# Patient Record
Sex: Female | Born: 1985 | Hispanic: Yes | Marital: Single | Smoking: Never smoker
Health system: Southern US, Community
[De-identification: ages and names within clinical notes are randomized; demographics above are authoritative.]

## PROBLEM LIST (undated history)

## (undated) DIAGNOSIS — N200 Calculus of kidney: Secondary | ICD-10-CM

## (undated) DIAGNOSIS — N83209 Unspecified ovarian cyst, unspecified side: Secondary | ICD-10-CM

## (undated) DIAGNOSIS — K219 Gastro-esophageal reflux disease without esophagitis: Secondary | ICD-10-CM

## (undated) HISTORY — DX: Calculus of kidney: N20.0

## (undated) HISTORY — DX: Unspecified ovarian cyst, unspecified side: N83.209

## (undated) HISTORY — DX: Gastro-esophageal reflux disease without esophagitis: K21.9

---

## 2009-10-09 HISTORY — PX: OOPHORECTOMY: SHX86

## 2010-10-09 HISTORY — PX: GALLBLADDER SURGERY: SHX652

## 2016-10-09 HISTORY — PX: KIDNEY STONE SURGERY: SHX686

## 2019-12-27 ENCOUNTER — Ambulatory Visit: Payer: Self-pay | Attending: Internal Medicine

## 2020-07-20 ENCOUNTER — Other Ambulatory Visit: Payer: Self-pay

## 2020-07-20 ENCOUNTER — Ambulatory Visit (INDEPENDENT_AMBULATORY_CARE_PROVIDER_SITE_OTHER): Payer: 59 | Admitting: Legal Medicine

## 2020-07-20 ENCOUNTER — Encounter: Payer: Self-pay | Admitting: Legal Medicine

## 2020-07-20 VITALS — BP 110/64 | HR 99 | Temp 97.7°F | Ht 65.75 in | Wt 223.4 lb

## 2020-07-20 DIAGNOSIS — J4520 Mild intermittent asthma, uncomplicated: Secondary | ICD-10-CM | POA: Diagnosis not present

## 2020-07-20 DIAGNOSIS — R5383 Other fatigue: Secondary | ICD-10-CM | POA: Diagnosis not present

## 2020-07-20 DIAGNOSIS — M542 Cervicalgia: Secondary | ICD-10-CM | POA: Diagnosis not present

## 2020-07-20 DIAGNOSIS — Z13220 Encounter for screening for lipoid disorders: Secondary | ICD-10-CM | POA: Diagnosis not present

## 2020-07-20 HISTORY — DX: Mild intermittent asthma, uncomplicated: J45.20

## 2020-07-20 LAB — PULMONARY FUNCTION TEST
FEV1/FVC: 71.7 %
FEV1: 2.42 L
FVC: 2.8 L

## 2020-07-20 MED ORDER — ALBUTEROL SULFATE HFA 108 (90 BASE) MCG/ACT IN AERS: 2.0000 | INHALATION_SPRAY | Freq: Four times a day (QID) | RESPIRATORY_TRACT | 0 refills | Status: DC | PRN

## 2020-07-20 NOTE — Patient Instructions (Signed)
  Ms. Amerman , Thank you for taking time to come for your Medicare Wellness Visit. I appreciate your ongoing commitment to your health goals. Please review the following plan we discussed and let me know if I can assist you in the future.   These are the goals we discussed: Goals   None     This is a list of the screening recommended for you and due dates:  Health Maintenance  Topic Date Due  .  Hepatitis C: One time screening is recommended by Center for Disease Control  (CDC) for  adults born from [birth year] through 1966.   Never done  . HIV Screening  Never done  . Tetanus Vaccine  Never done  . Pap Smear  Never done  . Flu Shot  Never done

## 2020-07-20 NOTE — Progress Notes (Signed)
New Patient Office Visit  Subjective:  Patient ID: Monica Randall, female    DOB: 1986/06/04  Age: 34 y.o. MRN: [id number]  CC:  Chief Complaint  Patient presents with  . Cough  . Back Pain    HPI Monica Randall presents for Asthma. Marla tranaslating since patient can only speak spanisn  Patient has mild intermittant asthma uncomplicated.  Asthma was diagnosed  child and has occasional daytime episodes and occasional night symptoms.  Patient is some symptoms and is using none.  Patient non smoker.  Cough  Neck pain at times, no trauma.  Past Medical History:  Diagnosis Date  . Kidney stones     Past Surgical History:  Procedure Laterality Date  . GALLBLADDER SURGERY  2012  . KIDNEY STONE SURGERY  2018   Laser surgery  . OOPHORECTOMY Right 2011    Family History  Problem Relation Age of Onset  . Thyroid disease Maternal Grandfather   . Thyroid disease Other     Social History   Socioeconomic History  . Marital status: Married    Spouse name: Not on file  . Number of children: Not on file  . Years of education: Not on file  . Highest education level: Not on file  Occupational History  . Occupation: Diplomatic Services operational officer  Tobacco Use  . Smoking status: Never Smoker  . Smokeless tobacco: Never Used  Substance and Sexual Activity  . Alcohol use: Not Currently  . Drug use: Never  . Sexual activity: Yes  Other Topics Concern  . Not on file  Social History Narrative  . Not on file   Social Determinants of Health   Financial Resource Strain:   . Difficulty of Paying Living Expenses: Not on file  Food Insecurity:   . Worried About Charity fundraiser in the Last Year: Not on file  . Ran Out of Food in the Last Year: Not on file  Transportation Needs:   . Lack of Transportation (Medical): Not on file  . Lack of Transportation (Non-Medical): Not on file  Physical Activity:   . Days of Exercise per Week: Not on file  . Minutes of Exercise per Session: Not on file    Stress:   . Feeling of Stress : Not on file  Social Connections:   . Frequency of Communication with Friends and Family: Not on file  . Frequency of Social Gatherings with Friends and Family: Not on file  . Attends Religious Services: Not on file  . Active Member of Clubs or Organizations: Not on file  . Attends Archivist Meetings: Not on file  . Marital Status: Not on file  Intimate Partner Violence:   . Fear of Current or Ex-Partner: Not on file  . Emotionally Abused: Not on file  . Physically Abused: Not on file  . Sexually Abused: Not on file    ROS Review of Systems  Constitutional: Positive for activity change. Negative for appetite change.  HENT: Negative.   Eyes: Negative.   Respiratory: Positive for cough and wheezing.   Cardiovascular: Negative for chest pain, palpitations and leg swelling.  Endocrine: Negative.   Genitourinary: Negative.   Neurological: Negative.   Psychiatric/Behavioral: Negative.     Objective:   Today's Vitals: BP 110/64   Pulse 99   Temp 97.7 F (36.5 C)   Ht 5' 5.75" (1.67 m)   Wt 223 lb 6.4 oz (101.3 kg)   SpO2 97%   BMI 36.33 kg/m   Physical Exam  Vitals reviewed.  Constitutional:      Appearance: Normal appearance.  HENT:     Right Ear: Tympanic membrane normal.     Left Ear: Tympanic membrane normal.     Nose: Nose normal.     Mouth/Throat:     Mouth: Mucous membranes are moist.  Eyes:     Extraocular Movements: Extraocular movements intact.     Conjunctiva/sclera: Conjunctivae normal.     Pupils: Pupils are equal, round, and reactive to light.  Cardiovascular:     Rate and Rhythm: Normal rate and regular rhythm.     Pulses: Normal pulses.     Heart sounds: Normal heart sounds.  Pulmonary:     Effort: Pulmonary effort is normal.     Breath sounds: Wheezing present.  Abdominal:     General: Abdomen is flat. Bowel sounds are normal.     Palpations: Abdomen is soft.  Musculoskeletal:        General: Normal  range of motion.     Cervical back: Normal range of motion and neck supple.  Skin:    General: Skin is warm.     Capillary Refill: Capillary refill takes less than 2 seconds.  Neurological:     General: No focal deficit present.     Mental Status: She is alert and oriented to person, place, and time. Mental status is at baseline.   PFT: pre- FVC 100%, FEV1 93%, FEV1.FVC 98, PEF 109, post bronchodilator is the same.  Assessment & Plan:   Problem List Items Addressed This Visit      Respiratory   Mild intermittent asthma   Relevant Medications   albuterol (VENTOLIN HFA) 108 (90 Base) MCG/ACT inhaler   Other Relevant Orders   CBC with Differential/Platelet   Pulmonary Function Test This patient has asthma mild and is on albuterol HFA.  Patient is having a flair.  Chronic medicines include albuterol HFA. Addition new medicines no.  Asthma action plan is in place.     Other Visit Diagnoses    Screening for hyperlipidemia    -  Primary   Relevant Orders   Lipid panel Patient needs to be screened for hyperlipidemia   Fatigue, unspecified type       Relevant Orders   Comprehensive metabolic panel   Hemoglobin A1c   TSH Patient remains fatigued aand needs further workup.   Cervical pain       Relevant Orders   DG Cervical Spine Complete Mild cervical pain, we discussed ROM exercises      Outpatient Encounter Medications as of 07/20/2020  Medication Sig  . Multiple Vitamins-Minerals (MULTIVITAMIN WITH MINERALS) tablet Take 1 tablet by mouth daily.  Marland Kitchen albuterol (VENTOLIN HFA) 108 (90 Base) MCG/ACT inhaler Inhale 2 puffs into the lungs every 6 (six) hours as needed for wheezing or shortness of breath.   No facility-administered encounter medications on file as of 07/20/2020.    Follow-up: Return in about 6 months (around 01/18/2021).   Reinaldo Meeker, MD

## 2020-07-21 ENCOUNTER — Encounter: Payer: Self-pay | Admitting: Legal Medicine

## 2020-07-21 ENCOUNTER — Other Ambulatory Visit: Payer: Self-pay

## 2020-07-21 DIAGNOSIS — E782 Mixed hyperlipidemia: Secondary | ICD-10-CM

## 2020-07-21 DIAGNOSIS — R7303 Prediabetes: Secondary | ICD-10-CM | POA: Insufficient documentation

## 2020-07-21 DIAGNOSIS — M542 Cervicalgia: Secondary | ICD-10-CM

## 2020-07-21 HISTORY — DX: Mixed hyperlipidemia: E78.2

## 2020-07-21 HISTORY — DX: Prediabetes: R73.03

## 2020-07-21 LAB — COMPREHENSIVE METABOLIC PANEL
ALT: 12 IU/L (ref 0–32)
AST: 17 IU/L (ref 0–40)
Albumin/Globulin Ratio: 1.4 (ref 1.2–2.2)
Albumin: 4.3 g/dL (ref 3.8–4.8)
Alkaline Phosphatase: 64 IU/L (ref 44–121)
BUN/Creatinine Ratio: 13 (ref 9–23)
BUN: 9 mg/dL (ref 6–20)
Bilirubin Total: <0.2 mg/dL (ref 0.0–1.2)
CO2: 22 mmol/L (ref 20–29)
Calcium: 9.5 mg/dL (ref 8.7–10.2)
Chloride: 102 mmol/L (ref 96–106)
Creatinine, Ser: 0.69 mg/dL (ref 0.57–1.00)
GFR calc Af Amer: 130 mL/min/{1.73_m2} (ref >59)
GFR calc non Af Amer: 113 mL/min/{1.73_m2} (ref >59)
Globulin, Total: 3.1 g/dL (ref 1.5–4.5)
Glucose: 92 mg/dL (ref 65–99)
Potassium: 4.4 mmol/L (ref 3.5–5.2)
Sodium: 140 mmol/L (ref 134–144)
Total Protein: 7.4 g/dL (ref 6.0–8.5)

## 2020-07-21 LAB — CBC WITH DIFFERENTIAL/PLATELET
Basophils Absolute: 0.1 10*3/uL (ref 0.0–0.2)
Basos: 1 %
EOS (ABSOLUTE): 0.1 10*3/uL (ref 0.0–0.4)
Eos: 1 %
Hematocrit: 45.5 % (ref 34.0–46.6)
Hemoglobin: 14.2 g/dL (ref 11.1–15.9)
Immature Grans (Abs): 0.1 10*3/uL (ref 0.0–0.1)
Immature Granulocytes: 1 %
Lymphocytes Absolute: 3.8 10*3/uL — ABNORMAL HIGH (ref 0.7–3.1)
Lymphs: 37 %
MCH: 26.9 pg (ref 26.6–33.0)
MCHC: 31.2 g/dL — ABNORMAL LOW (ref 31.5–35.7)
MCV: 86 fL (ref 79–97)
Monocytes Absolute: 0.8 10*3/uL (ref 0.1–0.9)
Monocytes: 7 %
Neutrophils Absolute: 5.5 10*3/uL (ref 1.4–7.0)
Neutrophils: 53 %
Platelets: 352 10*3/uL (ref 150–450)
RBC: 5.28 x10E6/uL (ref 3.77–5.28)
RDW: 12.9 % (ref 11.7–15.4)
WBC: 10.3 10*3/uL (ref 3.4–10.8)

## 2020-07-21 LAB — HEMOGLOBIN A1C
Est. average glucose Bld gHb Est-mCnc: 117 mg/dL
Hgb A1c MFr Bld: 5.7 % — ABNORMAL HIGH (ref 4.8–5.6)

## 2020-07-21 LAB — LIPID PANEL
Chol/HDL Ratio: 3 ratio (ref 0.0–4.4)
Cholesterol, Total: 192 mg/dL (ref 100–199)
HDL: 65 mg/dL (ref >39)
LDL Chol Calc (NIH): 95 mg/dL (ref 0–99)
Triglycerides: 188 mg/dL — ABNORMAL HIGH (ref 0–149)
VLDL Cholesterol Cal: 32 mg/dL (ref 5–40)

## 2020-07-21 LAB — TSH: TSH: 2.11 u[IU]/mL (ref 0.450–4.500)

## 2020-07-21 LAB — CARDIOVASCULAR RISK ASSESSMENT

## 2020-07-21 NOTE — Progress Notes (Signed)
CBC normal, kidney and liver tests normal, triglycerides high 188 watch diet, A1c 5.7 , This in in prediabetes range, should be on a diabetic diet, TSH 2.11 normal range

## 2020-07-27 ENCOUNTER — Other Ambulatory Visit: Payer: Self-pay | Admitting: Legal Medicine

## 2020-07-27 ENCOUNTER — Telehealth: Payer: Self-pay

## 2020-07-27 ENCOUNTER — Other Ambulatory Visit: Payer: Self-pay

## 2020-07-27 DIAGNOSIS — M542 Cervicalgia: Secondary | ICD-10-CM

## 2020-07-27 MED ORDER — CYCLOBENZAPRINE HCL 10 MG PO TABS: 10.0000 mg | ORAL_TABLET | Freq: Three times a day (TID) | ORAL | 0 refills | Status: DC | PRN

## 2020-07-27 NOTE — Progress Notes (Signed)
cy

## 2020-07-27 NOTE — Telephone Encounter (Signed)
Patient was informed that Dr Henrene Pastor sent flexeril to the pharmacy. Recommeded to call us back if symptopms persistent.

## 2020-09-13 ENCOUNTER — Encounter: Payer: Self-pay | Admitting: Legal Medicine

## 2020-09-13 ENCOUNTER — Other Ambulatory Visit: Payer: Self-pay

## 2020-09-13 ENCOUNTER — Ambulatory Visit (INDEPENDENT_AMBULATORY_CARE_PROVIDER_SITE_OTHER): Payer: 59 | Admitting: Legal Medicine

## 2020-09-13 DIAGNOSIS — N63 Unspecified lump in unspecified breast: Secondary | ICD-10-CM

## 2020-09-13 DIAGNOSIS — E282 Polycystic ovarian syndrome: Secondary | ICD-10-CM

## 2020-09-13 NOTE — Progress Notes (Signed)
Subjective:  Patient ID: Monica Randall, female    DOB: 05-19-1986  Age: 34 y.o. MRN: [id number]  Chief Complaint  Patient presents with  . Breast Pain    pt requesting referral to GYN, flu shot   translator used from [facility] HPI: patient having breast both breast L.R. no discharge or trauma.  ONe year ago.  Periods regular , pelvic pain. For long time has poly cystic ovaries.  Patient just wants referral to GYN.   Current Outpatient Medications on File Prior to Visit  Medication Sig Dispense Refill  . albuterol (VENTOLIN HFA) 108 (90 Base) MCG/ACT inhaler Inhale 2 puffs into the lungs every 6 (six) hours as needed for wheezing or shortness of breath. 8 g 0  . cyclobenzaprine (FLEXERIL) 10 MG tablet Take 1 tablet (10 mg total) by mouth 3 (three) times daily as needed for muscle spasms. 30 tablet 0  . Multiple Vitamins-Minerals (MULTIVITAMIN WITH MINERALS) tablet Take 1 tablet by mouth daily.     No current facility-administered medications on file prior to visit.   Past Medical History:  Diagnosis Date  . Kidney stones   . Mild intermittent asthma 07/20/2020  . Mixed hyperlipidemia 07/21/2020  . Prediabetes 07/21/2020   Past Surgical History:  Procedure Laterality Date  . GALLBLADDER SURGERY  2012  . KIDNEY STONE SURGERY  2018   Laser surgery  . OOPHORECTOMY Right 2011    Family History  Problem Relation Age of Onset  . Thyroid disease Maternal Grandfather   . Thyroid disease Other    Social History   Socioeconomic History  . Marital status: Married    Spouse name: Not on file  . Number of children: Not on file  . Years of education: Not on file  . Highest education level: Not on file  Occupational History  . Occupation: Diplomatic Services operational officer  Tobacco Use  . Smoking status: Never Smoker  . Smokeless tobacco: Never Used  Substance and Sexual Activity  . Alcohol use: Not Currently  . Drug use: Never  . Sexual activity: Yes  Other Topics Concern  . Not on file  Social History  Narrative  . Not on file   Social Determinants of Health   Financial Resource Strain:   . Difficulty of Paying Living Expenses: Not on file  Food Insecurity:   . Worried About Charity fundraiser in the Last Year: Not on file  . Ran Out of Food in the Last Year: Not on file  Transportation Needs:   . Lack of Transportation (Medical): Not on file  . Lack of Transportation (Non-Medical): Not on file  Physical Activity:   . Days of Exercise per Week: Not on file  . Minutes of Exercise per Session: Not on file  Stress:   . Feeling of Stress : Not on file  Social Connections:   . Frequency of Communication with Friends and Family: Not on file  . Frequency of Social Gatherings with Friends and Family: Not on file  . Attends Religious Services: Not on file  . Active Member of Clubs or Organizations: Not on file  . Attends Archivist Meetings: Not on file  . Marital Status: Not on file    Review of Systems  Constitutional: Negative for activity change, chills and fever.  HENT: Negative for congestion, mouth sores and nosebleeds.   Respiratory: Negative for apnea, choking and wheezing.   Cardiovascular: Positive for chest pain (breasts). Negative for palpitations and leg swelling.  Gastrointestinal: Positive for abdominal  pain (pelvic).  Genitourinary: Positive for flank pain. Negative for difficulty urinating, dyspareunia and dysuria.  Musculoskeletal: Positive for arthralgias. Negative for back pain and gait problem.     Objective:  BP 110/80   Pulse (!) 102   Temp (!) 97.4 F (36.3 C)   Ht 5\' 7"  (1.702 m)   Wt 222 lb (100.7 kg)   SpO2 98%   BMI 34.77 kg/m   BP/Weight 09/13/2020 96/01/5408  Systolic BP 811 914  Diastolic BP 80 64  Wt. (Lbs) 222 223.4  BMI 34.77 36.33  sandra [id number]  Physical Exam Vitals reviewed.  Constitutional:      Appearance: Normal appearance. She is obese.  Eyes:     Extraocular Movements: Extraocular movements intact.     Pupils:  Pupils are equal, round, and reactive to light.  Cardiovascular:     Rate and Rhythm: Normal rate and regular rhythm.     Pulses: Normal pulses.     Heart sounds: Normal heart sounds.  Pulmonary:     Effort: Pulmonary effort is normal.     Breath sounds: Normal breath sounds.  Abdominal:     Tenderness: There is abdominal tenderness.  Musculoskeletal:        General: Normal range of motion.  Neurological:     Mental Status: She is alert and oriented to person, place, and time.       Lab Results  Component Value Date   WBC 10.3 07/20/2020   HGB 14.2 07/20/2020   HCT 45.5 07/20/2020   PLT 352 07/20/2020   GLUCOSE 92 07/20/2020   CHOL 192 07/20/2020   TRIG 188 (H) 07/20/2020   HDL 65 07/20/2020   LDLCALC 95 07/20/2020   ALT 12 07/20/2020   AST 17 07/20/2020   NA 140 07/20/2020   K 4.4 07/20/2020   CL 102 07/20/2020   CREATININE 0.69 07/20/2020   BUN 9 07/20/2020   CO2 22 07/20/2020   TSH 2.110 07/20/2020   HGBA1C 5.7 (H) 07/20/2020      Assessment & Plan:  Diagnoses and all orders for this visit: Breast mass -     Ambulatory referral to Gynecology Patient having bilateral breast pain for one years left greager than right  PCOS (polycystic ovarian syndrome) -     Ambulatory referral to Gynecology Chronic lower pelvic pain chronic         Follow-up: Return if symptoms worsen or fail to improve.  An After Visit Summary was printed and given to the patient.  Reinaldo Meeker, MD [facility] [phone]

## 2020-09-27 ENCOUNTER — Ambulatory Visit: Payer: 59 | Admitting: Obstetrics & Gynecology

## 2020-10-12 ENCOUNTER — Other Ambulatory Visit: Payer: Self-pay

## 2020-10-12 ENCOUNTER — Ambulatory Visit: Payer: Self-pay | Admitting: Obstetrics & Gynecology

## 2020-10-20 ENCOUNTER — Other Ambulatory Visit: Payer: Self-pay

## 2020-10-20 ENCOUNTER — Encounter: Payer: Self-pay | Admitting: Obstetrics & Gynecology

## 2020-11-30 ENCOUNTER — Other Ambulatory Visit: Payer: Self-pay

## 2020-11-30 ENCOUNTER — Ambulatory Visit (INDEPENDENT_AMBULATORY_CARE_PROVIDER_SITE_OTHER): Payer: 59 | Admitting: Obstetrics & Gynecology

## 2020-11-30 ENCOUNTER — Encounter: Payer: Self-pay | Admitting: Obstetrics & Gynecology

## 2020-11-30 VITALS — BP 128/84 | Ht 64.5 in | Wt 223.0 lb

## 2020-11-30 DIAGNOSIS — Z30011 Encounter for initial prescription of contraceptive pills: Secondary | ICD-10-CM

## 2020-11-30 DIAGNOSIS — Z1151 Encounter for screening for human papillomavirus (HPV): Secondary | ICD-10-CM

## 2020-11-30 DIAGNOSIS — Z01419 Encounter for gynecological examination (general) (routine) without abnormal findings: Secondary | ICD-10-CM

## 2020-11-30 DIAGNOSIS — Z113 Encounter for screening for infections with a predominantly sexual mode of transmission: Secondary | ICD-10-CM

## 2020-11-30 DIAGNOSIS — Z23 Encounter for immunization: Secondary | ICD-10-CM

## 2020-11-30 MED ORDER — NORETHIN ACE-ETH ESTRAD-FE 1-20 MG-MCG(24) PO TABS: 1.0000 | ORAL_TABLET | Freq: Every day | ORAL | 4 refills | Status: DC

## 2020-11-30 NOTE — Progress Notes (Signed)
Monica Randall Dec 30, 1985 [id number]   History:    35 y.o. G19P0A2 Married  RP:  New patient presenting for annual gyn exam   HPI: Well on BCPs, stopped this month because ran out of pills.  Used condoms.  Would like to restart on BCPs, doesn't remember the name of her BCP.  No BTB.  No pelvic pain.  Urine/BMs normal.  Breasts normal.  BMI 37.69.  Needs to increase physical activities.  Past medical history,surgical history, family history and social history were all reviewed and documented in the EPIC chart.  Gynecologic History Patient's last menstrual period was 11/10/2020.  Obstetric History OB History  Gravida Para Term Preterm AB Living  2 0     2    SAB IAB Ectopic Multiple Live Births  2            # Outcome Date GA Lbr Len/2nd Weight Sex Delivery Anes PTL Lv  2 SAB           1 SAB              ROS: A ROS was performed and pertinent positives and negatives are included in the history.  GENERAL: No fevers or chills. HEENT: No change in vision, no earache, sore throat or sinus congestion. NECK: No pain or stiffness. CARDIOVASCULAR: No chest pain or pressure. No palpitations. PULMONARY: No shortness of breath, cough or wheeze. GASTROINTESTINAL: No abdominal pain, nausea, vomiting or diarrhea, melena or bright red blood per rectum. GENITOURINARY: No urinary frequency, urgency, hesitancy or dysuria. MUSCULOSKELETAL: No joint or muscle pain, no back pain, no recent trauma. DERMATOLOGIC: No rash, no itching, no lesions. ENDOCRINE: No polyuria, polydipsia, no heat or cold intolerance. No recent change in weight. HEMATOLOGICAL: No anemia or easy bruising or bleeding. NEUROLOGIC: No headache, seizures, numbness, tingling or weakness. PSYCHIATRIC: No depression, no loss of interest in normal activity or change in sleep pattern.     Exam:   BP 128/84   Ht 5' 4.5" (1.638 m)   Wt 223 lb (101.2 kg)   LMP 11/10/2020 Comment: PILL  BMI 37.69 kg/m   Body mass index is 37.69  kg/m.  General appearance : Well developed well nourished female. No acute distress HEENT: Eyes: no retinal hemorrhage or exudates,  Neck supple, trachea midline, no carotid bruits, no thyroidmegaly Lungs: Clear to auscultation, no rhonchi or wheezes, or rib retractions  Heart: Regular rate and rhythm, no murmurs or gallops Breast:Examined in sitting and supine position were symmetrical in appearance, no palpable masses or tenderness,  no skin retraction, no nipple inversion, no nipple discharge, no skin discoloration, no axillary or supraclavicular lymphadenopathy Abdomen: no palpable masses or tenderness, no rebound or guarding Extremities: no edema or skin discoloration or tenderness  Pelvic: Vulva: Normal             Vagina: No gross lesions or discharge  Cervix: No gross lesions or discharge.  Pap/HPV HR, Gono-Chlam done.  Uterus  AV, normal size, shape and consistency, non-tender and mobile  Adnexa  Without masses or tenderness  Anus: Normal   Assessment/Plan:  35 y.o. female for annual exam   1. Encounter for routine gynecological examination with Papanicolaou smear of cervix Normal gynecologic exam.  Pap test with high-risk HPV done today.  Breast exam normal. Body mass index 37.69.  Recommend a lower calorie/carb diet with intermittent fasting.  Aerobic activities 5 times a week and light weightlifting every 2 days.  2. Encounter for initial prescription of  contraceptive pills Desires contraception.  Decision to start on birth control pills.  No contraindication.  Birth control pill usage reviewed.  The generic of Loestrin 24 FE 1/20 prescribed.  3. Screen for STD (sexually transmitted disease) - Gono-Chlam done on Pap - HIV antibody (with reflex) - RPR - Hepatitis B Surface AntiGEN - Hepatitis C Antibody  4. Need for HPV vaccination First injection given today.  Other orders - PRESCRIPTION MEDICATION; Birth control - name?? - Norethindrone Acetate-Ethinyl Estrad-FE  (LOESTRIN 24 FE) 1-20 MG-MCG(24) tablet; Take 1 tablet by mouth daily.  Princess Bruins MD, 2:54 PM 11/30/2020

## 2020-12-01 ENCOUNTER — Encounter: Payer: Self-pay | Admitting: Obstetrics & Gynecology

## 2020-12-01 LAB — RPR: RPR Ser Ql: NONREACTIVE

## 2020-12-01 LAB — HEPATITIS C ANTIBODY
Hepatitis C Ab: NONREACTIVE
SIGNAL TO CUT-OFF: 0.01 (ref <1.00)

## 2020-12-01 LAB — HEPATITIS B SURFACE ANTIGEN: Hepatitis B Surface Ag: NONREACTIVE

## 2020-12-01 LAB — HIV ANTIBODY (ROUTINE TESTING W REFLEX): HIV 1&2 Ab, 4th Generation: NONREACTIVE

## 2020-12-02 LAB — PAP IG, CT-NG NAA, HPV HIGH-RISK
C. trachomatis RNA, TMA: NOT DETECTED
HPV DNA High Risk: NOT DETECTED
N. gonorrhoeae RNA, TMA: NOT DETECTED

## 2020-12-09 ENCOUNTER — Other Ambulatory Visit: Payer: Self-pay

## 2020-12-09 MED ORDER — CYCLOBENZAPRINE HCL 10 MG PO TABS: 10.0000 mg | ORAL_TABLET | Freq: Two times a day (BID) | ORAL | 1 refills | Status: DC

## 2020-12-09 MED ORDER — ALBUTEROL SULFATE HFA 108 (90 BASE) MCG/ACT IN AERS: 2.0000 | INHALATION_SPRAY | Freq: Two times a day (BID) | RESPIRATORY_TRACT | 1 refills | Status: DC

## 2020-12-15 ENCOUNTER — Other Ambulatory Visit: Payer: Self-pay | Admitting: Legal Medicine

## 2020-12-15 DIAGNOSIS — Z1231 Encounter for screening mammogram for malignant neoplasm of breast: Secondary | ICD-10-CM

## 2021-01-03 ENCOUNTER — Ambulatory Visit
Admission: RE | Admit: 2021-01-03 | Discharge: 2021-01-03 | Disposition: A | Discharge status: Home / Self Care | Payer: 59 | Source: Ambulatory Visit | Attending: Legal Medicine | Admitting: Legal Medicine

## 2021-01-03 DIAGNOSIS — Z1231 Encounter for screening mammogram for malignant neoplasm of breast: Secondary | ICD-10-CM

## 2021-01-04 NOTE — Progress Notes (Signed)
Breast normal, no malignancy lp

## 2021-01-18 ENCOUNTER — Other Ambulatory Visit: Payer: Self-pay

## 2021-01-18 ENCOUNTER — Ambulatory Visit: Payer: 59 | Admitting: Legal Medicine

## 2021-01-18 ENCOUNTER — Encounter: Payer: Self-pay | Admitting: Legal Medicine

## 2021-01-18 VITALS — BP 102/80 | HR 87 | Temp 97.4°F | Resp 16 | Ht 64.5 in | Wt 222.0 lb

## 2021-01-18 DIAGNOSIS — J4520 Mild intermittent asthma, uncomplicated: Secondary | ICD-10-CM

## 2021-01-18 DIAGNOSIS — R922 Inconclusive mammogram: Secondary | ICD-10-CM

## 2021-01-18 DIAGNOSIS — R7303 Prediabetes: Secondary | ICD-10-CM

## 2021-01-18 DIAGNOSIS — E282 Polycystic ovarian syndrome: Secondary | ICD-10-CM

## 2021-01-18 DIAGNOSIS — G5603 Carpal tunnel syndrome, bilateral upper limbs: Secondary | ICD-10-CM | POA: Diagnosis not present

## 2021-01-18 DIAGNOSIS — E782 Mixed hyperlipidemia: Secondary | ICD-10-CM | POA: Diagnosis not present

## 2021-01-18 NOTE — Progress Notes (Addendum)
Subjective:  Patient ID: Monica Randall, female    DOB: 1986-06-14  Age: 35 y.o. MRN: [id number]  Chief Complaint  Patient presents with  . Numbness    HPI: chronic visit Numbness of hands, worse in morning.  There is no numbness now.  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  Mammogram was normal but dense tissue, read as normal.   Patient has prediabetes and PCOS.   Current Outpatient Medications on File Prior to Visit  Medication Sig Dispense Refill  . albuterol (VENTOLIN HFA) 108 (90 Base) MCG/ACT inhaler Inhale 2 puffs into the lungs in the morning and at bedtime. 8 g 1  . cyclobenzaprine (FLEXERIL) 10 MG tablet Take 1 tablet (10 mg total) by mouth in the morning and at bedtime. 30 tablet 1  . Multiple Vitamins-Minerals (MULTIVITAMIN WITH MINERALS) tablet Take 1 tablet by mouth daily.    . Norethindrone Acetate-Ethinyl Estrad-FE (LOESTRIN 24 FE) 1-20 MG-MCG(24) tablet Take 1 tablet by mouth daily. 84 tablet 4  . PRESCRIPTION MEDICATION Birth control - name??     No current facility-administered medications on file prior to visit.   Past Medical History:  Diagnosis Date  . Kidney stones   . Mild intermittent asthma 07/20/2020  . Mixed hyperlipidemia 07/21/2020  . Prediabetes 07/21/2020   Past Surgical History:  Procedure Laterality Date  . GALLBLADDER SURGERY  2012  . KIDNEY STONE SURGERY  2018   Laser surgery  . OOPHORECTOMY Right 2011    Family History  Problem Relation Age of Onset  . Thyroid disease Maternal Grandfather   . Thyroid disease Other   . Cancer Maternal Aunt        leukemia   Social History   Socioeconomic History  . Marital status: Married    Spouse name: Not on file  . Number of children: Not on file  . Years of education: Not on file  . Highest education level: Not on file  Occupational History  . Occupation: Diplomatic Services operational officer  Tobacco Use  . Smoking status: Never Smoker  . Smokeless tobacco: Never Used  Vaping Use  . Vaping Use: Never  used  Substance and Sexual Activity  . Alcohol use: Not Currently  . Drug use: Never  . Sexual activity: Yes    Partners: Male    Comment: 1st intercourse- 12, partners- 42, current partner- 3 yrs   Other Topics Concern  . Not on file  Social History Narrative  . Not on file   Social Determinants of Health   Financial Resource Strain: Not on file  Food Insecurity: Not on file  Transportation Needs: Not on file  Physical Activity: Not on file  Stress: Not on file  Social Connections: Not on file    Review of Systems  Constitutional: Negative for activity change and appetite change.  HENT: Negative.   Eyes: Negative for visual disturbance.  Respiratory: Negative for chest tightness and shortness of breath.   Cardiovascular: Negative for chest pain, palpitations and leg swelling.  Gastrointestinal: Negative for abdominal distention and abdominal pain.  Endocrine: Negative for polyuria.  Genitourinary: Negative for difficulty urinating and dyspareunia.  Musculoskeletal: Negative for arthralgias and back pain.  Neurological: Positive for numbness (both hands).  Hematological: Negative.   Psychiatric/Behavioral: Negative.      Objective:  BP 102/80   Pulse 87   Temp (!) 97.4 F (36.3 C)   Resp 16   Ht 5' 4.5" (1.638 m)   Wt 222 lb (100.7 kg)  LMP 12/31/2020   SpO2 97%   BMI 37.52 kg/m   BP/Weight 01/18/2021 11/30/2020 97/12/5327  Systolic BP 924 268 341  Diastolic BP 80 84 80  Wt. (Lbs) 222 223 222  BMI 37.52 37.69 34.77    Physical Exam Vitals reviewed.  Constitutional:      Appearance: Normal appearance.  HENT:     Right Ear: Tympanic membrane normal.     Left Ear: Tympanic membrane normal.  Eyes:     Extraocular Movements: Extraocular movements intact.     Pupils: Pupils are equal, round, and reactive to light.  Cardiovascular:     Rate and Rhythm: Normal rate and regular rhythm.     Pulses: Normal pulses.     Heart sounds: Normal heart sounds.   Pulmonary:     Effort: Pulmonary effort is normal.     Breath sounds: Normal breath sounds.  Abdominal:     General: Abdomen is flat. Bowel sounds are normal.     Palpations: Abdomen is soft.  Musculoskeletal:        General: Normal range of motion.     Cervical back: Neck supple.     Comments: Negative tinel sign, bilat, normal sensation  Skin:    General: Skin is warm and dry.  Neurological:     General: No focal deficit present.     Mental Status: She is alert and oriented to person, place, and time.    Diabetic Foot Exam - Simple   Simple Foot Form Diabetic Foot exam was performed with the following findings: Yes 01/19/2021  7:36 AM  Visual Inspection No deformities, no ulcerations, no other skin breakdown bilaterally: Yes Sensation Testing Intact to touch and monofilament testing bilaterally: Yes Pulse Check Posterior Tibialis and Dorsalis pulse intact bilaterally: Yes Comments      Lab Results  Component Value Date   WBC 7.4 01/18/2021   HGB 13.9 01/18/2021   HCT 42.0 01/18/2021   PLT 378 01/18/2021   GLUCOSE 87 01/18/2021   CHOL 182 01/18/2021   TRIG 99 01/18/2021   HDL 64 01/18/2021   LDLCALC 100 (H) 01/18/2021   ALT 15 01/18/2021   AST 24 01/18/2021   NA 141 01/18/2021   K 4.8 01/18/2021   CL 102 01/18/2021   CREATININE 0.71 01/18/2021   BUN 9 01/18/2021   CO2 20 01/18/2021   TSH 2.110 07/20/2020   HGBA1C 5.6 01/18/2021      Assessment & Plan:   Diagnoses and all orders for this visit: Bilateral carpal tunnel syndrome -     Nerve conduction test We will get NCS and continue to brace wrists  Dense breast tissue on mammogram Dense breast tissue but no evidence for cancer, routine follow up  Mild intermittent asthma without complication This patient has asthma mild and is on albuterol.  Patient is not having a flair.  Chronic medicines include albuterol. Addition new medicines none.  Asthma action plan is in place.   Mixed hyperlipidemia -      Lipid panel AN INDIVIDUAL CARE PLAN for hyperlipidemia/ cholesterol was established and reinforced today.  The patient's status was assessed using clinical findings on exam, lab and other diagnostic tests. The patient's disease status was assessed based on evidence-based guidelines and found to be fair controlled. MEDICATIONS were reviewed. SELF MANAGEMENT GOALS have been discussed and patient's success at attaining the goal of low cholesterol was assessed. RECOMMENDATION given include regular exercise 3 days a week and low cholesterol/low fat diet. CLINICAL SUMMARY including  written plan to identify barriers unique to the patient due to social or economic  reasons was discussed.  Prediabetes -     Hemoglobin A1c -     CBC with Differential/Platelet -     Comprehensive metabolic panel Patient has prediabetes and is on diet  PCOS (polycystic ovarian syndrome) -     CBC with Differential/Platelet -     Comprehensive metabolic panel Not  Under treatment at present. Other orders -     Cardiovascular Risk Assessment      Follow-up: Return in about 1 month (around 02/17/2021) for carpal tunnel.  An After Visit Summary was printed and given to the patient.  Reinaldo Meeker, MD [facility] [phone]

## 2021-01-19 LAB — CBC WITH DIFFERENTIAL/PLATELET
Basophils Absolute: 0.1 10*3/uL (ref 0.0–0.2)
Basos: 1 %
EOS (ABSOLUTE): 0.1 10*3/uL (ref 0.0–0.4)
Eos: 1 %
Hematocrit: 42 % (ref 34.0–46.6)
Hemoglobin: 13.9 g/dL (ref 11.1–15.9)
Immature Grans (Abs): 0 10*3/uL (ref 0.0–0.1)
Immature Granulocytes: 0 %
Lymphocytes Absolute: 2.6 10*3/uL (ref 0.7–3.1)
Lymphs: 35 %
MCH: 28.2 pg (ref 26.6–33.0)
MCHC: 33.1 g/dL (ref 31.5–35.7)
MCV: 85 fL (ref 79–97)
Monocytes Absolute: 0.4 10*3/uL (ref 0.1–0.9)
Monocytes: 6 %
Neutrophils Absolute: 4.3 10*3/uL (ref 1.4–7.0)
Neutrophils: 57 %
Platelets: 378 10*3/uL (ref 150–450)
RBC: 4.93 x10E6/uL (ref 3.77–5.28)
RDW: 13.1 % (ref 11.7–15.4)
WBC: 7.4 10*3/uL (ref 3.4–10.8)

## 2021-01-19 LAB — LIPID PANEL
Chol/HDL Ratio: 2.8 ratio (ref 0.0–4.4)
Cholesterol, Total: 182 mg/dL (ref 100–199)
HDL: 64 mg/dL (ref >39)
LDL Chol Calc (NIH): 100 mg/dL — ABNORMAL HIGH (ref 0–99)
Triglycerides: 99 mg/dL (ref 0–149)
VLDL Cholesterol Cal: 18 mg/dL (ref 5–40)

## 2021-01-19 LAB — COMPREHENSIVE METABOLIC PANEL
ALT: 15 IU/L (ref 0–32)
AST: 24 IU/L (ref 0–40)
Albumin/Globulin Ratio: 1.7 (ref 1.2–2.2)
Albumin: 4.6 g/dL (ref 3.8–4.8)
Alkaline Phosphatase: 68 IU/L (ref 44–121)
BUN/Creatinine Ratio: 13 (ref 9–23)
BUN: 9 mg/dL (ref 6–20)
Bilirubin Total: 0.2 mg/dL (ref 0.0–1.2)
CO2: 20 mmol/L (ref 20–29)
Calcium: 9.4 mg/dL (ref 8.7–10.2)
Chloride: 102 mmol/L (ref 96–106)
Creatinine, Ser: 0.71 mg/dL (ref 0.57–1.00)
Globulin, Total: 2.7 g/dL (ref 1.5–4.5)
Glucose: 87 mg/dL (ref 65–99)
Potassium: 4.8 mmol/L (ref 3.5–5.2)
Sodium: 141 mmol/L (ref 134–144)
Total Protein: 7.3 g/dL (ref 6.0–8.5)
eGFR: 114 mL/min/{1.73_m2} (ref >59)

## 2021-01-19 LAB — CARDIOVASCULAR RISK ASSESSMENT

## 2021-01-19 LAB — HEMOGLOBIN A1C
Est. average glucose Bld gHb Est-mCnc: 114 mg/dL
Hgb A1c MFr Bld: 5.6 % (ref 4.8–5.6)

## 2021-01-19 NOTE — Progress Notes (Signed)
A1c 5.6, good, LDL cholesterol 100, CBC normal, kidney and liver tests normal lp

## 2021-01-31 ENCOUNTER — Ambulatory Visit (INDEPENDENT_AMBULATORY_CARE_PROVIDER_SITE_OTHER): Payer: 59 | Admitting: *Deleted

## 2021-01-31 ENCOUNTER — Other Ambulatory Visit: Payer: Self-pay

## 2021-01-31 VITALS — BP 110/70 | HR 74 | Resp 16 | Wt 222.0 lb

## 2021-01-31 DIAGNOSIS — Z23 Encounter for immunization: Secondary | ICD-10-CM | POA: Diagnosis not present

## 2021-01-31 NOTE — Progress Notes (Signed)
Ina Homes is the interpretor for patient. Patient is aware to return in 61mths for her last gardasil injection

## 2021-02-21 ENCOUNTER — Ambulatory Visit: Payer: 59 | Admitting: Legal Medicine

## 2021-02-22 ENCOUNTER — Encounter: Payer: Self-pay | Admitting: Legal Medicine

## 2021-02-22 ENCOUNTER — Ambulatory Visit: Payer: 59 | Admitting: Legal Medicine

## 2021-02-22 ENCOUNTER — Other Ambulatory Visit: Payer: Self-pay

## 2021-02-22 VITALS — BP 102/80 | HR 78 | Temp 97.4°F | Resp 16 | Ht 64.5 in | Wt 224.0 lb

## 2021-02-22 DIAGNOSIS — G5603 Carpal tunnel syndrome, bilateral upper limbs: Secondary | ICD-10-CM

## 2021-02-22 NOTE — Progress Notes (Signed)
Subjective:  Patient ID: Monica Randall, female    DOB: 12/01/1985  Age: 35 y.o. MRN: [id number]  Chief Complaint  Patient presents with  . Carpal Tunnel    HPI: patient has bilateral CTS.she is raising her hands at night and she is having lest CTS.  She still needs NCS.  marla translated Current Outpatient Medications on File Prior to Visit  Medication Sig Dispense Refill  . albuterol (VENTOLIN HFA) 108 (90 Base) MCG/ACT inhaler Inhale 2 puffs into the lungs in the morning and at bedtime. 8 g 1  . Multiple Vitamins-Minerals (MULTIVITAMIN WITH MINERALS) tablet Take 1 tablet by mouth daily.    . Norethindrone Acetate-Ethinyl Estrad-FE (LOESTRIN 24 FE) 1-20 MG-MCG(24) tablet Take 1 tablet by mouth daily. 84 tablet 4   No current facility-administered medications on file prior to visit.   Past Medical History:  Diagnosis Date  . Kidney stones   . Mild intermittent asthma 07/20/2020  . Mixed hyperlipidemia 07/21/2020  . Prediabetes 07/21/2020   Past Surgical History:  Procedure Laterality Date  . GALLBLADDER SURGERY  2012  . KIDNEY STONE SURGERY  2018   Laser surgery  . OOPHORECTOMY Right 2011    Family History  Problem Relation Age of Onset  . Thyroid disease Maternal Grandfather   . Thyroid disease Other   . Cancer Maternal Aunt        leukemia   Social History   Socioeconomic History  . Marital status: Married    Spouse name: Not on file  . Number of children: Not on file  . Years of education: Not on file  . Highest education level: Not on file  Occupational History  . Occupation: Dipl[employer] Services operational officer  Tobacco Use  . Smoking status: Never Smoker  . Smokeless tobacco: Never Used  Vaping Use  . Vaping Use: Never used  Substance and Sexual Activity  . Alcohol use: Not Currently  . Drug use: Never  . Sexual activity: Yes    Partners: Male    Comment: 1st intercourse- 12, partners- 36, current partner- 3 yrs   Other Topics Concern  . Not on file  Social History Narrative   . Not on file   Social Determinants of Health   Financial Resource Strain: Not on file  Food Insecurity: Not on file  Transportation Needs: Not on file  Physical Activity: Not on file  Stress: Not on file  Social Connections: Not on file    Review of Systems  Constitutional: Negative for activity change and appetite change.  HENT: Negative for rhinorrhea.   Eyes: Negative for visual disturbance.  Respiratory: Negative for chest tightness and shortness of breath.   Cardiovascular: Negative for palpitations and leg swelling.  Gastrointestinal: Negative for abdominal distention and abdominal pain.  Endocrine: Positive for polyuria.  Genitourinary: Negative for difficulty urinating.  Musculoskeletal: Negative for arthralgias and back pain.  Skin: Negative.   Neurological: Positive for numbness.  Psychiatric/Behavioral: Negative.      Objective:  BP 102/80   Pulse 78   Temp (!) 97.4 F (36.3 C)   Resp 16   Ht 5' 4.5" (1.638 m)   Wt 224 lb (101.6 kg)   LMP 01/26/2021 (Exact Date)   SpO2 97%   BMI 37.86 kg/m   BP/Weight 02/22/2021 01/31/2021 6/44/0347  Systolic BP 425 956 387  Diastolic BP 80 70 80  Wt. (Lbs) 224 222 222  BMI 37.86 37.52 37.52    Physical Exam Vitals reviewed.  Constitutional:  Appearance: Normal appearance.  HENT:     Right Ear: Tympanic membrane, ear canal and external ear normal.     Left Ear: Tympanic membrane, ear canal and external ear normal.  Cardiovascular:     Rate and Rhythm: Normal rate and regular rhythm.     Pulses: Normal pulses.     Heart sounds: Normal heart sounds. No murmur heard. No gallop.   Pulmonary:     Effort: No respiratory distress.     Breath sounds: Normal breath sounds. No rales.  Abdominal:     General: Abdomen is flat. Bowel sounds are normal. There is no distension.     Palpations: Abdomen is soft.     Tenderness: There is no abdominal tenderness.  Musculoskeletal:        General: Normal range of  motion.  Skin:    General: Skin is warm.     Capillary Refill: Capillary refill takes less than 2 seconds.  Neurological:     Mental Status: She is alert and oriented to person, place, and time. Mental status is at baseline.     Comments: Negative tinel bilaterally, no sensory or motor loss in hands.       Lab Results  Component Value Date   WBC 7.4 01/18/2021   HGB 13.9 01/18/2021   HCT 42.0 01/18/2021   PLT 378 01/18/2021   GLUCOSE 87 01/18/2021   CHOL 182 01/18/2021   TRIG 99 01/18/2021   HDL 64 01/18/2021   LDLCALC 100 (H) 01/18/2021   ALT 15 01/18/2021   AST 24 01/18/2021   NA 141 01/18/2021   K 4.8 01/18/2021   CL 102 01/18/2021   CREATININE 0.71 01/18/2021   BUN 9 01/18/2021   CO2 20 01/18/2021   TSH 2.110 07/20/2020   HGBA1C 5.6 01/18/2021      Assessment & Plan:   1. Bilateral carpal tunnel syndrome Bilateral CTS  Improving and need NCS       Follow-up: Return in about 1 month (around 03/25/2021) for for CTS.  An After Visit Summary was printed and given to the patient.  Reinaldo Meeker, MD [facility] [phone]

## 2021-02-24 ENCOUNTER — Telehealth: Payer: Self-pay | Admitting: *Deleted

## 2021-02-24 NOTE — Telephone Encounter (Signed)
Patient called stating she did have any refills on Loestrin 24 FE tablet. I see Dr.Lavoie sent Rx on 11/30/20 #84 with 4 refill. I called and tried to speak with the pharmacist however the phone cut off x 2. I called and left a detailed message on pharmacy doctor line and Rx information asking them to fill.

## 2021-03-01 ENCOUNTER — Other Ambulatory Visit: Payer: Self-pay

## 2021-03-01 ENCOUNTER — Encounter: Payer: Self-pay | Admitting: Neurology

## 2021-03-01 DIAGNOSIS — R202 Paresthesia of skin: Secondary | ICD-10-CM

## 2021-03-09 ENCOUNTER — Other Ambulatory Visit: Payer: Self-pay

## 2021-03-09 ENCOUNTER — Ambulatory Visit (INDEPENDENT_AMBULATORY_CARE_PROVIDER_SITE_OTHER): Payer: 59 | Admitting: Neurology

## 2021-03-09 DIAGNOSIS — G5603 Carpal tunnel syndrome, bilateral upper limbs: Secondary | ICD-10-CM

## 2021-03-09 DIAGNOSIS — R202 Paresthesia of skin: Secondary | ICD-10-CM | POA: Diagnosis not present

## 2021-03-09 NOTE — Progress Notes (Signed)
Positive nCS for CTS refer to ortho lp

## 2021-03-09 NOTE — Procedures (Signed)
[facility]  [address], [place]  [place], [place] [address] Tel: [phone] Fax:  [phone] Test Date:  03/09/2021  Patient: Monica Randall DOB: [birth year] Physician: Narda Amber, DO  Sex: Female Height: 5\' 4"  Ref Phys: Ann Held, MD  ID#: [id number]   Technician:    Patient Complaints: This is a 35 year old female referred for evaluation of bilateral hand numbness and tingling.  NCV & EMG Findings: Extensive electrodiagnostic testing of the right upper extremity and additional studies of the left shows:  1. Bilateral median sensory responses show prolonged latency (R3.9, L3.9 ms).  Bilateral ulnar sensory responses are within normal limits. 2. Bilateral median motor responses show prolonged latency (R4.3, L4.1 ms).  Bilateral ulnar motor responses are within normal limits.   3. There is no evidence of active or chronic motor axonal loss changes affecting any of the tested muscles.  Motor unit configuration and recruitment pattern is within normal limits.    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     ___________________________ Narda Amber, DO    Nerve Conduction Studies Anti Sensory Summary Table   Stim Site NR Peak (ms) Norm Peak (ms) P-T Amp (V) Norm P-T Amp  Left Median Anti Sensory (2nd Digit)  32C  Wrist    3.9 <3.4 23.7 >20  Right Median Anti Sensory (2nd Digit)  32C  Wrist    3.9 <3.4 24.6 >20  Left Ulnar Anti Sensory (5th Digit)  32C  Wrist    2.4 <3.1 25.9 >12  Right Ulnar Anti Sensory (5th Digit)  32C  Wrist    2.2 <3.1 27.8 >12   Motor Summary Table   Stim Site NR Onset (ms) Norm Onset (ms) O-P Amp (mV) Norm O-P Amp Site1 Site2 Delta-0 (ms) Dist (cm) Vel (m/s) Norm Vel (m/s)  Left Median Motor (Abd Poll Brev)  32C  Wrist    4.1 <3.9 8.4 >6 Elbow Wrist 4.9 27.0 55 >50  Elbow    9.0  8.1         Right Median Motor (Abd Poll  Brev)  32C  Wrist    4.3 <3.9 8.8 >6 Elbow Wrist 4.5 27.0 60 >50  Elbow    8.8  8.6         Left Ulnar Motor (Abd Dig Minimi)  32C  Wrist    2.0 <3.1 11.0 >7 B Elbow Wrist 3.3 21.0 64 >50  B Elbow    5.3  10.7  A Elbow B Elbow 1.8 10.0 56 >50  A Elbow    7.1  10.7         Right Ulnar Motor (Abd Dig Minimi)  32C  Wrist    1.9 <3.1 11.0 >7 B Elbow Wrist 3.3 21.0 64 >50  B Elbow    5.2  10.3  A Elbow B Elbow 1.8 10.0 56 >50  A Elbow    7.0  10.3          EMG   Side Muscle Ins Act Fibs Psw Fasc Number Recrt Dur Dur. Amp Amp. Poly Poly. Comment  Right 1stDorInt Nml Nml Nml Nml Nml Nml Nml Nml Nml Nml Nml Nml N/A  Right Abd Poll Brev Nml Nml Nml Nml Nml Nml Nml Nml Nml Nml Nml Nml N/A  Right PronatorTeres Nml Nml Nml Nml Nml Nml Nml Nml Nml Nml Nml Nml N/A  Right Biceps Nml Nml Nml Nml Nml  Nml Nml Nml Nml Nml Nml Nml N/A  Right Triceps Nml Nml Nml Nml Nml Nml Nml Nml Nml Nml Nml Nml N/A  Right Deltoid Nml Nml Nml Nml Nml Nml Nml Nml Nml Nml Nml Nml N/A  Left 1stDorInt Nml Nml Nml Nml Nml Nml Nml Nml Nml Nml Nml Nml N/A  Left Abd Poll Brev Nml Nml Nml Nml Nml Nml Nml Nml Nml Nml Nml Nml N/A  Left PronatorTeres Nml Nml Nml Nml Nml Nml Nml Nml Nml Nml Nml Nml N/A  Left Biceps Nml Nml Nml Nml Nml Nml Nml Nml Nml Nml Nml Nml N/A  Left Triceps Nml Nml Nml Nml Nml Nml Nml Nml Nml Nml Nml Nml N/A  Left Deltoid Nml Nml Nml Nml Nml Nml Nml Nml Nml Nml Nml Nml N/A      Waveforms:

## 2021-04-18 ENCOUNTER — Encounter: Payer: Self-pay | Admitting: Legal Medicine

## 2021-04-18 ENCOUNTER — Ambulatory Visit: Payer: 59 | Admitting: Legal Medicine

## 2021-04-18 ENCOUNTER — Other Ambulatory Visit: Payer: Self-pay

## 2021-04-18 DIAGNOSIS — G5600 Carpal tunnel syndrome, unspecified upper limb: Secondary | ICD-10-CM | POA: Insufficient documentation

## 2021-04-18 DIAGNOSIS — J9811 Atelectasis: Secondary | ICD-10-CM | POA: Diagnosis not present

## 2021-04-18 DIAGNOSIS — G5603 Carpal tunnel syndrome, bilateral upper limbs: Secondary | ICD-10-CM | POA: Diagnosis not present

## 2021-04-18 NOTE — Progress Notes (Signed)
Established Patient Office Visit  Subjective:  Patient ID: Monica Randall, female    DOB: April 01, 1986  Age: 35 y.o. MRN: [id number]  CC:  Chief Complaint  Patient presents with   Carpal Tunnel   Pneumonia   Follow-up    HPI Monica Randall presents for follow up on hospital visit on 03/18/2021.  She had strep throat and some atelectasis on CXR, no cough, fever or chills,  She has not seen neuro for NCS.  She gets numbness both hands bilaterally.  Radiated to elbow.she is not using wrist braces.    Past Medical History:  Diagnosis Date   Kidney stones    Mild intermittent asthma 07/20/2020   Mixed hyperlipidemia 07/21/2020   Prediabetes 07/21/2020    Past Surgical History:  Procedure Laterality Date   GALLBLADDER SURGERY  2012   KIDNEY STONE SURGERY  2018   Laser surgery   OOPHORECTOMY Right 2011    Family History  Problem Relation Age of Onset   Thyroid disease Maternal Grandfather    Thyroid disease Other    Cancer Maternal Aunt        leukemia    Social History   Socioeconomic History   Marital status: Married    Spouse name: Not on file   Number of children: Not on file   Years of education: Not on file   Highest education level: Not on file  Occupational History   Occupation: Dipl[employer] Services operational officer  Tobacco Use   Smoking status: Never   Smokeless tobacco: Never  Vaping Use   Vaping Use: Never used  Substance and Sexual Activity   Alcohol use: Not Currently   Drug use: Never   Sexual activity: Yes    Partners: Male    Comment: 1st intercourse- 76, partners- 21, current partner- 3 yrs   Other Topics Concern   Not on file  Social History Narrative   Not on file   Social Determinants of Health   Financial Resource Strain: Not on file  Food Insecurity: Not on file  Transportation Needs: Not on file  Physical Activity: Not on file  Stress: Not on file  Social Connections: Not on file  Intimate Partner Violence: Not on file    Outpatient Medications Prior to  Visit  Medication Sig Dispense Refill   Norethindrone Acetate-Ethinyl Estrad-FE (LOESTRIN 24 FE) 1-20 MG-MCG(24) tablet Take 1 tablet by mouth daily. 84 tablet 4   albuterol (VENTOLIN HFA) 108 (90 Base) MCG/ACT inhaler Inhale 2 puffs into the lungs in the morning and at bedtime. 8 g 1   Multiple Vitamins-Minerals (MULTIVITAMIN WITH MINERALS) tablet Take 1 tablet by mouth daily.     No facility-administered medications prior to visit.    No Known Allergies  ROS Review of Systems  Constitutional:  Negative for activity change and appetite change.  HENT:  Negative for congestion and dental problem.   Eyes:  Negative for visual disturbance.  Respiratory:  Negative for chest tightness.   Cardiovascular:  Negative for chest pain, palpitations and leg swelling.  Gastrointestinal:  Negative for abdominal distention, abdominal pain and anal bleeding.  Genitourinary:  Negative for difficulty urinating.  Musculoskeletal:  Negative for arthralgias.  Skin: Negative.   Psychiatric/Behavioral: Negative.       Objective:    Physical Exam Vitals reviewed.  Constitutional:      Appearance: Normal appearance.  HENT:     Head: Normocephalic and atraumatic.     Right Ear: Tympanic membrane normal.     Left Ear: Tympanic  membrane normal.     Mouth/Throat:     Mouth: Mucous membranes are moist.     Pharynx: Oropharynx is clear.  Eyes:     Extraocular Movements: Extraocular movements intact.     Pupils: Pupils are equal, round, and reactive to light.  Cardiovascular:     Rate and Rhythm: Normal rate and regular rhythm.     Pulses: Normal pulses.     Heart sounds: Normal heart sounds. No murmur heard.   No gallop.  Pulmonary:     Effort: Pulmonary effort is normal. No respiratory distress.     Breath sounds: No wheezing.  Abdominal:     General: Abdomen is flat. Bowel sounds are normal. There is no distension.     Palpations: Abdomen is soft.     Tenderness: There is no abdominal  tenderness.  Musculoskeletal:     Cervical back: Normal range of motion.  Skin:    General: Skin is warm.     Capillary Refill: Capillary refill takes less than 2 seconds.  Neurological:     Mental Status: She is alert.     Comments: Positive tinel and phalen bilaterally on wrist.  Refer to surgery    BP 100/64   Pulse 88   Temp 97.8 F (36.6 C)   Resp 15   Ht 5' 4.5" (1.638 m)   Wt 223 lb (101.2 kg)   SpO2 97%   BMI 37.69 kg/m  Wt Readings from Last 3 Encounters:  04/18/21 223 lb (101.2 kg)  02/22/21 224 lb (101.6 kg)  01/31/21 222 lb (100.7 kg)     Health Maintenance Due  Topic Date Due   TETANUS/TDAP  Never done    There are no preventive care reminders to display for this patient.  Lab Results  Component Value Date   TSH 2.110 07/20/2020   Lab Results  Component Value Date   WBC 7.4 01/18/2021   HGB 13.9 01/18/2021   HCT 42.0 01/18/2021   MCV 85 01/18/2021   PLT 378 01/18/2021   Lab Results  Component Value Date   NA 141 01/18/2021   K 4.8 01/18/2021   CO2 20 01/18/2021   GLUCOSE 87 01/18/2021   BUN 9 01/18/2021   CREATININE 0.71 01/18/2021   BILITOT 0.2 01/18/2021   ALKPHOS 68 01/18/2021   AST 24 01/18/2021   ALT 15 01/18/2021   PROT 7.3 01/18/2021   ALBUMIN 4.6 01/18/2021   CALCIUM 9.4 01/18/2021   EGFR 114 01/18/2021   Lab Results  Component Value Date   CHOL 182 01/18/2021   Lab Results  Component Value Date   HDL 64 01/18/2021   Lab Results  Component Value Date   LDLCALC 100 (H) 01/18/2021   Lab Results  Component Value Date   TRIG 99 01/18/2021   Lab Results  Component Value Date   CHOLHDL 2.8 01/18/2021   Lab Results  Component Value Date   HGBA1C 5.6 01/18/2021      Assessment & Plan:   Problem List Items Addressed This Visit       Respiratory   Atelectasis Patient has atelectasis on CXR at hospital, no pneumonia, X-ray reviewed     Nervous and Auditory   CTS (carpal tunnel syndrome) Patient continues to  have CTS with bilateral NCS abnormalities, refer to neuro       Follow-up: Return if symptoms worsen or fail to improve.    Reinaldo Meeker, MD

## 2021-05-25 ENCOUNTER — Ambulatory Visit: Payer: 59 | Admitting: Legal Medicine

## 2021-05-25 ENCOUNTER — Encounter: Payer: Self-pay | Admitting: Legal Medicine

## 2021-05-25 ENCOUNTER — Other Ambulatory Visit: Payer: Self-pay

## 2021-05-25 VITALS — BP 110/70 | HR 89 | Temp 97.5°F | Resp 16 | Ht 64.5 in | Wt 225.0 lb

## 2021-05-25 DIAGNOSIS — N644 Mastodynia: Secondary | ICD-10-CM | POA: Insufficient documentation

## 2021-05-25 DIAGNOSIS — A09 Infectious gastroenteritis and colitis, unspecified: Secondary | ICD-10-CM

## 2021-05-25 MED ORDER — DIPHENOXYLATE-ATROPINE 2.5-0.025 MG PO TABS: 1.0000 | ORAL_TABLET | Freq: Four times a day (QID) | ORAL | 0 refills | Status: DC | PRN

## 2021-05-25 NOTE — Progress Notes (Signed)
Established Patient Office Visit  Subjective:  Patient ID: Monica Randall, female    DOB: 17-Jan-1986  Age: 35 y.o. MRN: [id number]  CC:  Chief Complaint  Patient presents with   Breast Pain    HPI Monica Randall presents for breasts. The breasts remain painful. All over chest wall.  She has large breasts. She has pain is suspensory ligaments, some fibrocystic changes on mammogram, she is off caffiene.  Patient having diarrhea for 4 weeks.  She has city water.  No fever or chills.  3 times a day.  Needs cultures. No contact with other sick people.  No recent travel.  No cramping.  No history of IBS.  Past Medical History:  Diagnosis Date   Kidney stones    Mild intermittent asthma 07/20/2020   Mixed hyperlipidemia 07/21/2020   Prediabetes 07/21/2020    Past Surgical History:  Procedure Laterality Date   GALLBLADDER SURGERY  2012   KIDNEY STONE SURGERY  2018   Laser surgery   OOPHORECTOMY Right 2011    Family History  Problem Relation Age of Onset   Thyroid disease Maternal Grandfather    Thyroid disease Other    Cancer Maternal Aunt        leukemia    Social History   Socioeconomic History   Marital status: Married    Spouse name: Not on file   Number of children: Not on file   Years of education: Not on file   Highest education level: Not on file  Occupational History   Occupation: Diplomatic Services operational officer  Tobacco Use   Smoking status: Never   Smokeless tobacco: Never  Vaping Use   Vaping Use: Never used  Substance and Sexual Activity   Alcohol use: Not Currently   Drug use: Never   Sexual activity: Yes    Partners: Male    Comment: 1st intercourse- 42, partners- 34, current partner- 3 yrs   Other Topics Concern   Not on file  Social History Narrative   Not on file   Social Determinants of Health   Financial Resource Strain: Not on file  Food Insecurity: Not on file  Transportation Needs: Not on file  Physical Activity: Not on file  Stress: Not on file  Social  Connections: Not on file  Intimate Partner Violence: Not on file    Outpatient Medications Prior to Visit  Medication Sig Dispense Refill   Norethindrone Acetate-Ethinyl Estrad-FE (LOESTRIN 24 FE) 1-20 MG-MCG(24) tablet Take 1 tablet by mouth daily. 84 tablet 4   No facility-administered medications prior to visit.    No Known Allergies  ROS Review of Systems  Constitutional:  Negative for activity change and appetite change.  HENT:  Negative for congestion.   Respiratory:  Negative for chest tightness and shortness of breath.   Cardiovascular:  Negative for chest pain and palpitations.  Gastrointestinal:  Negative for abdominal distention and abdominal pain.  Genitourinary:  Negative for difficulty urinating and dysuria.  Musculoskeletal:  Negative for arthralgias and back pain.  Neurological: Negative.   Psychiatric/Behavioral: Negative.       Objective:    Physical Exam Vitals reviewed.  Constitutional:      Appearance: Normal appearance. She is obese.  HENT:     Right Ear: Tympanic membrane, ear canal and external ear normal.     Left Ear: Tympanic membrane, ear canal and external ear normal.  Cardiovascular:     Rate and Rhythm: Normal rate and regular rhythm.     Pulses: Normal pulses.  Heart sounds: No murmur heard.   No gallop.  Pulmonary:     Effort: Pulmonary effort is normal. No respiratory distress.     Breath sounds: Normal breath sounds. No wheezing.  Chest:     Chest wall: Tenderness present.  Breasts:    Tanner Score is 5.     Right: Tenderness present.     Left: Tenderness present.       Comments: Pa along suspension lines of breast Musculoskeletal:     Cervical back: Normal range of motion.  Lymphadenopathy:     Upper Body:     Right upper body: No supraclavicular, axillary or pectoral adenopathy.     Left upper body: No supraclavicular, axillary or pectoral adenopathy.  Skin:    General: Skin is warm and dry.     Capillary Refill:  Capillary refill takes less than 2 seconds.  Neurological:     General: No focal deficit present.     Mental Status: She is alert and oriented to person, place, and time.    BP 110/70   Pulse 89   Temp (!) 97.5 F (36.4 C)   Resp 16   Ht 5' 4.5" (1.638 m)   Wt 225 lb (102.1 kg)   SpO2 97%   BMI 38.02 kg/m  Wt Readings from Last 3 Encounters:  05/25/21 225 lb (102.1 kg)  04/18/21 223 lb (101.2 kg)  02/22/21 224 lb (101.6 kg)     Health Maintenance Due  Topic Date Due   TETANUS/TDAP  Never done   INFLUENZA VACCINE  05/09/2021    There are no preventive care reminders to display for this patient.  Lab Results  Component Value Date   TSH 2.110 07/20/2020   Lab Results  Component Value Date   WBC 7.4 01/18/2021   HGB 13.9 01/18/2021   HCT 42.0 01/18/2021   MCV 85 01/18/2021   PLT 378 01/18/2021   Lab Results  Component Value Date   NA 141 01/18/2021   K 4.8 01/18/2021   CO2 20 01/18/2021   GLUCOSE 87 01/18/2021   BUN 9 01/18/2021   CREATININE 0.71 01/18/2021   BILITOT 0.2 01/18/2021   ALKPHOS 68 01/18/2021   AST 24 01/18/2021   ALT 15 01/18/2021   PROT 7.3 01/18/2021   ALBUMIN 4.6 01/18/2021   CALCIUM 9.4 01/18/2021   EGFR 114 01/18/2021   Lab Results  Component Value Date   CHOL 182 01/18/2021   Lab Results  Component Value Date   HDL 64 01/18/2021   Lab Results  Component Value Date   LDLCALC 100 (H) 01/18/2021   Lab Results  Component Value Date   TRIG 99 01/18/2021   Lab Results  Component Value Date   CHOLHDL 2.8 01/18/2021   Lab Results  Component Value Date   HGBA1C 5.6 01/18/2021      Assessment & Plan:  Diagnoses and all orders for this visit: Diarrhea of infectious origin -     Cdiff NAA+O+P+Stool Culture -     diphenoxylate-atropine (LOMOTIL) 2.5-0.025 MG tablet; Take 1 tablet by mouth 4 (four) times daily as needed for diarrhea or loose stools. Patient is having long term diarrhea with no melena or red blood.  No  changes in diet.  Check of infectious etiology. Mastalgia in female -     Ambulatory referral to General Surgery Patient will need breast reduction to ease the discomfort    Meds ordered this encounter  Medications   diphenoxylate-atropine (LOMOTIL) 2.5-0.025 MG tablet  Sig: Take 1 tablet by mouth 4 (four) times daily as needed for diarrhea or loose stools.    Dispense:  30 tablet    Refill:  0    Follow-up: Return in about 2 weeks (around 06/08/2021) for diarrhea.    Reinaldo Meeker, MD

## 2021-05-25 NOTE — Patient Instructions (Signed)
Diarrea en los adultos Diarrhea, Adult La diarrea ocurre cuando se hace materia fecal (heces) blanda y acuosa con frecuencia. La diarrea puede hacerlo sentir dbil y hacer que pierda el agua del cuerpo (deshidratarlo). La prdida del agua del cuerpo puede hacer: Que sienta cansancio y sed. Que tenga la boca seca. Que haga pis (orine) con menos frecuencia. [place]. Sin embargo, puede durar ms tiempo si se trata de un signo de algo ms serio. Es importante tratar la diarrea como se lohaya indicado el mdico. Siga estas indicaciones en su casa: Comida y bebida     Siga estas indicaciones como se lo haya indicado el mdico: Tome una SRO (solucin de rehidratacin oral). Esta es una bebida que ayuda a [employer] lquidos y los minerales que el cuerpo perdi. Se vende en farmacias y tiendas. Beba abundantes lquidos, tales como: [place]. Trocitos de hielo. Jugo de frutas diluido. Bebidas deportivas de bajas caloras. Leche, si lo desea. Evite beber lquidos con alto contenido de azcar o cafena. En la medida en que pueda, consuma alimentos blandos y fciles de digerir en pequeas cantidades. Estos alimentos incluyen: Bananas. Pur de [employer]. Arroz. Carnes bajas en grasa Dorothea Glassman). Pan tostado. Galletas. Evite tomar alcohol. Evite los alimentos condimentados o con alto contenido de [place].  Medicamentos Delphi de venta libre y los recetados solamente como se lo haya indicado el mdico. Si le recetaron un antibitico, tmelo como se lo haya indicado el mdico. No deje de usar el antibitico aunque comience a Sports administrator. Indicaciones generales  Lvese las manos frecuentemente usando agua y [place]. Use desinfectante para manos si no dispone de [place] y [place]. Las International Paper de la casa deben lavarse las manos tambin. Las manos deben lavarse: Despus de usar el bao o cambiar un paal. Antes de preparar, cocinar o servir la comida. Mientras  cuida de una persona enferma. Cuando visita a alguien en el hospital. Beba suficiente lquido para mantener la orina de color amarillo plido. Descanse en su casa hasta sentirse mejor. Controle su afeccin para Actuary cambio. Tome un bao con agua tibia para Writer ardor o dolor a causa de la diarrea. Concurra a todas las visitas de control como se lo haya indicado el mdico. Esto es importante.  Comunquese con un mdico si: Tiene fiebre. La diarrea empeora. Aparecen nuevos sntomas. No puede retener los lquidos. Se siente aturdido o mareado. Tiene dolor de [place]. Tiene calambres musculares. Solicite ayuda inmediatamente si: Electronics engineer. Se siente muy dbil o se desvanece (se desmaya). Hace materia fecal con sangre, de color negro o que parece alquitrn. Tiene dolor muy intenso en el vientre (abdomen), clicos o meteorismo. Tiene problemas para respirar o respira muy rpidamente. Su corazn late muy rpidamente. Siente la piel fra y hmeda. Se siente confundido. Tiene signos de haber perdido [place] cantidad excesiva de agua del cuerpo, como: [place], muy escasa o falta de [place]. Labios agrietados. Sequedad de boca. Ojos hundidos. Somnolencia. Debilidad. Resumen La diarrea ocurre cuando se hace materia fecal (heces) blanda y acuosa con frecuencia. La diarrea puede hacerlo sentir dbil y hacer que pierda el agua del cuerpo (deshidratarlo). Tome una SRO (solucin de rehidratacin oral). Es Ardelia Mems bebida que se vende en farmacias y tiendas. En la medida en que pueda, consuma alimentos blandos y fciles de digerir en pequeas cantidades. Comunquese con un mdico si su afeccin empeora. Solicite ayuda de inmediato si tiene signos de Risk manager perdido  una cantidad excesiva de agua del cuerpo. Esta informacin no tiene Marine scientist el consejo del mdico. Asegresede hacerle al mdico cualquier pregunta que tenga. Document Revised: 04/01/2018  Document Reviewed: 04/01/2018 Elsevier Patient Education  [place].

## 2021-05-31 ENCOUNTER — Other Ambulatory Visit: Payer: Self-pay | Admitting: Legal Medicine

## 2021-06-02 ENCOUNTER — Ambulatory Visit (INDEPENDENT_AMBULATORY_CARE_PROVIDER_SITE_OTHER): Payer: 59 | Admitting: *Deleted

## 2021-06-02 ENCOUNTER — Other Ambulatory Visit: Payer: Self-pay

## 2021-06-02 VITALS — BP 114/72 | HR 84 | Resp 20

## 2021-06-02 DIAGNOSIS — Z23 Encounter for immunization: Secondary | ICD-10-CM

## 2021-06-02 NOTE — Progress Notes (Signed)
Patient is here for 3rd gardasil 9 vaccine.  Last injection received 01/31/21.

## 2021-06-08 ENCOUNTER — Ambulatory Visit: Payer: 59 | Admitting: Legal Medicine

## 2021-06-08 ENCOUNTER — Other Ambulatory Visit: Payer: Self-pay

## 2021-06-08 ENCOUNTER — Encounter: Payer: Self-pay | Admitting: Legal Medicine

## 2021-06-08 VITALS — BP 100/80 | HR 85 | Temp 97.4°F | Resp 16 | Ht 64.5 in | Wt 226.0 lb

## 2021-06-08 DIAGNOSIS — R5383 Other fatigue: Secondary | ICD-10-CM | POA: Diagnosis not present

## 2021-06-08 DIAGNOSIS — K2100 Gastro-esophageal reflux disease with esophagitis, without bleeding: Secondary | ICD-10-CM

## 2021-06-08 DIAGNOSIS — A09 Infectious gastroenteritis and colitis, unspecified: Secondary | ICD-10-CM | POA: Diagnosis not present

## 2021-06-08 DIAGNOSIS — K219 Gastro-esophageal reflux disease without esophagitis: Secondary | ICD-10-CM | POA: Insufficient documentation

## 2021-06-08 MED ORDER — OMEPRAZOLE 40 MG PO CPDR: 40.0000 mg | DELAYED_RELEASE_CAPSULE | Freq: Every day | ORAL | 3 refills | Status: DC

## 2021-06-08 NOTE — Patient Instructions (Signed)
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 El gluten es una protena que se encuentra en el trigo, el centeno, la [place] y algunos otros granos. Seguir una dieta sin gluten es el nico tratamiento para las personas con Software engineer. Ayuda a evitar daos en los intestinos y [place] o elimina los sntomas de la [place]. Seguir una dieta sin gluten requiere algo de planificacin. Al principio puede ser un desafo, pero con el tiempo y la prctica, se hace ms fcil. En la actualidad hay ms opciones sin gluten que antes. Si necesita ayuda para conseguir alimentos sin gluten o si tiene preguntas, hable con un nutricionista o con el mdico. Consejos para seguir [employer] plan Lea las etiquetas de los alimentos Lea todas las etiquetas de los alimentos. En general, el gluten se agrega a los alimentos. Siempre controle la lista de ingredientes y las advertencias, como "puede contener gluten". Los alimentos que [employer] Corporation de estas palabras clave en la etiqueta, generalmente contienen gluten: [place], [place], [place] enriquecida, harina con bromatos, [place], [place] de trigo duro, [place] de trigo sin Hotel manager, [place] fosfatada, harina leudante, smola, almidn, cebada ([place]), centeno y [place]. Almidn, dextrina, almidn de maz modificado o cereales. Espesantes, rellenos o emulsiones. Saborizantes de [place], extracto de [place] y jarabe de [place]. Protena vegetal hidrolizada. En los [place] los alimentos envasados sin gluten deben tener la etiqueta "GF" (gluten-free). Estos alimentos son fciles de identificar y su consumo es seguro. En los [place] tambin se exige a las compaas alimenticias que exhiban en las etiquetas, los alrgenos alimenticios ms comunes, incluido el trigo. Al ir de compras Cuando vaya al mercado, comience comprando en los sectores de verduras,  carnes y lcteos. Es ms probable que estos sectores tengan alimentos sin gluten. Luego vaya a los pasillos de alimentos envasados, si fuera necesario. Planificacin de las comidas Todas las frutas, verduras y carnes no contienen gluten y son seguras. Hable con el nutricionista o el mdico antes de tomar suplementos minerales y multivitaminas sin gluten. Recuerde que los alimentos sin gluten no deben estar en contacto con alimentos que contienen gluten (contaminacin cruzada). Esto puede ocurrir Financial planner y con alimentos procesados. Pregunte al mdico o al nutricionista sobre cmo reducir el riesgo de contaminacin cruzada en su hogar. Si tiene dudas sobre cmo se proces un alimento, pregntele al fabricante. Qu alimentos puedo comer? Frutas Todas las frutas frescas, [place], en conserva, frutas secas y jugos de frutas 100 %. Verduras Goldman [employer] frescos, congelados y [place]. Granos [place], harinas de frijol, harina 100 % de trigo sarraceno, maz, mijo, comidas o harinas de frutos secos, avena sin gluten, quinua, arroz, sorgo, tef, galletas de arroz, tortillas de harina de maz, palomitas de maz y cereales de maz calientes.  Maz descascarado, arroz y arroz salvaje. Algunos fideos asiticos de arroz o judas.  Almidn de arrurruz, salvado de maz, harina de maz, germen de maz, harina de maz, almidn de maz, [place] de papa, [place] de almidn de papa y salvado de Occupational psychologist. Harinas de [employer], integral y [place]. Arroz pulido, harina de soja, almidn de tapioca. Carnes y otros alimentos ricos en protenas Todas las carnes frescas de [place], [place], ave, pescado, [place], y [place]. Pescado enlatado en agua, aceite, salmuera o caldo de verduras. Frutos secos y semillas sin procesar, [place] de man.  Algunas carnes precocinadas o curadas, como embutidos o panes de  carne. Algunas salchichas de [employer]. Porotos y [employer], y lentejas. Lcteos Leche natural fresca, en  polvo, evaporada o condensada. [place], [place], crema agria, crema dulce y la [place] de los yogures. Queso sin procesar, la mayora de quesos procesados, algunos quesos cottage y algunos quesos crema. Bebidas. Caf, t, la Parker Hann[employer]in ts de hierbas. Bebidas gaseosas y [place] cervezas de Press photographer. Vino, sake, bebidas espirituosas destiladas como gin, vodka y whisky. [place] sidras fermentadas. Grasas y aceites [place], [place], aceite vegetal, [place] hidrogenada, aceite de [place], [place] vegetal, grasa de cerdo, crema y [place]. Algunos aderezos comerciales para ensaladas. Aceitunas. Dulces y [employer], miel, algunos [place], [place], [place] y [place]. Caramelos duros, malvaviscos y pastillas de [place].  [place] en polvo puro. Chocolate puro. Natillas y Campbell Soup para pdines. Postres de Leisure centre manager, sorbetes y helados de [place].  Tortas, galletas y otros postres preparados con las harinas permitidas. Algunos helados comerciales. Pdines de [place], tapioca y arroz. Condimentos y otros alimentos Algunas sopas enlatadas y congeladas. Glutamato monosdico (MSG). Sidra, vinagre de arroz y de vino. Bicarbonato de sodio y polvo de Teacher, music.  [place]. Levadura nutricional y para hornear. Algunas salsas de soja hechas sin trigo (consulte a su nutricionista sobre marcas especficas permitidas).  Frutos secos, coco y chocolate. Sal, pimienta, hierbas, especias, condimentos, saborizadores naturales y artificiales y colorantes comestibles.  Algunos medicamentos y suplementos. Bruna Potter de arroz. Es posible que los productos que se enumeran ms [place] no constituyan una lista completa de los alimentos y las bebidas que puede tomar. Consulte a un nutricionista para obtener ms informacin. Qu alimentos debo evitar? Frutas Confituras o mermeladas de fruta, y algunos rellenos para tartas. Algunos refrigerios de frutas y rollitos frutales. Verduras La mayora de las verduras en crema  y conservas de verduras en salsas. Algunas verduras y ensaladas preparadas de marca comercial. Verduras con aderezo o marinada de salsa de soja. Granos [place], salvado, burgol, cuscs, trigo partido, [place], farro, [place] de [place], [place], pan [place], [place], germen de trigo y todos los cereales de trigo y centeno, incluidos espelta y Teacher, early years/pre.  Cereales que contengan [place] como saborizante, como el cereal de arroz. Fideos comunes, espaguetis, Programme researcher, broadcasting/film/video; Presenter, broadcasting parte de las mezclas de arroz [place], y las que contengan trigo, [place], [place] o triticale. Carnes y otros alimentos ricos en protenas Cleora Fleet carne o sustituto que [place] trigo, [place], [place], o estabilizadores del gluten. Estas suelen ser carnes envasadas o marinadas y carnes precocinadas o curadas, como embutidos o panes de carne. Productos que contengan pan, como filete suizo, croquetas, [place] y [place] de carne. La mayora de los atunes enlatados en caldo de verduras y [place] con protenas vegetales hidrolizadas inyectadas como parte del relleno. Seitn. Pescado de imitacin. Huevos en salsas hechas con ingredientes que se deben evitar. Lcteos [place] chocolatadas y [place] de marca comercial. Algunos sustitutos no lcteos. Cualquier producto con queso que contenga ingredientes que se deben evitar. Bebidas. Ciertas bebidas de cereal. Cerveza, ale, [place] and the [place] y algunas cervezas de Press photographer. Algunas sidras fermentadas. Algunos cafs saborizados instantneos. Algunos ts de hierbas hechos con cebada o [place] de cebada agregada. Grasas y aceites Algunos aderezos comerciales para ensaladas. [place] que contiene almidn comestible modificado. Dulces y postres Algunos caramelos masticables. Frutos secos recubiertos en chocolate (pueden estar rebozados en harina de trigo) y algunos caramelos y barras dulces de marca comercial.  [place] tortas, galletitas dulces, rosquillas, pasteles y otros productos horneados. Algunos helados  comerciales. Conos de helado.  Todas las  mezclas de marca comercial para preparar tortas, galletas y otros postres. Budn de pan y otros pdines espesados con [place].  Productos que contienen jarabe de arroz integral hecho con enzimas de [place] de cebada. Postres y [employer]G Corporation con saborizantes de [place]. Condimentos y otros alimentos Algunos tipos de curry en polvo, algunas mezclas de aderezos secos, algunos extractos de carne, algunas salsas con carne, algunos tipos de ktchup, algunos tipos de [place], y rbanos picantes.  Algunas salsas de soja. Vinagre de [place]. Caldos y caldos en cubos que contengan protenas vegetales hidrolizadas. Algunas salsas para untar, y [place] gomas de Higher education careers adviser.  Extracto de levadura. Levadura de cerveza. Colorante de caramelo.  Algunos medicamentos y suplementos. Es posible que los productos que se enumeran ms [place] no constituyan una lista completa de los alimentos y las bebidas que Nurse, adult. Consulte a un nutricionista para obtener ms informacin. Resumen El gluten es una protena que se encuentra en el trigo, el centeno, la [place] y algunos otros granos. La dieta sin gluten incluye todos los alimentos que no contienen gluten. Si necesita ayuda para conseguir alimentos sin gluten o si tiene preguntas, hable con un nutricionista o con el mdico. Lea todas las etiquetas de los alimentos. En general, el gluten se agrega a los alimentos. Siempre controle la lista de ingredientes y las advertencias, como "puede contener gluten". Esta informacin no tiene Marine scientist el consejo del mdico. Asegrese de hacerle al mdico cualquier pregunta que tenga. Document Revised: 11/05/2019 Document Reviewed: 11/05/2019 Elsevier Patient Education  2022 [employer].

## 2021-06-08 NOTE — Progress Notes (Signed)
Acute Office Visit  Subjective:    Patient ID: Monica Randall, female    DOB: 26-Mar-1986, 35 y.o.   MRN: [id number]  Chief Complaint  Patient presents with   Diarrhea    Patient mentioned started diarrhea since 3 months ago, she noticed is more with lactose product.     HPI Patient is in today for Continued diarrhea, no help with immodium, she is using lactose free milk.  BM # 2, loose stool, not formed  all cultures negative.      Past Medical History:  Diagnosis Date   Kidney stones    Mild intermittent asthma 07/20/2020   Mixed hyperlipidemia 07/21/2020   Prediabetes 07/21/2020    Past Surgical History:  Procedure Laterality Date   GALLBLADDER SURGERY  2012   KIDNEY STONE SURGERY  2018   Laser surgery   OOPHORECTOMY Right 2011    Family History  Problem Relation Age of Onset   Thyroid disease Maternal Grandfather    Thyroid disease Other    Cancer Maternal Aunt        leukemia    Social History   Socioeconomic History   Marital status: Married    Spouse name: Not on file   Number of children: Not on file   Years of education: Not on file   Highest education level: Not on file  Occupational History   Occupation: Diplomatic Services operational officer  Tobacco Use   Smoking status: Never   Smokeless tobacco: Never  Vaping Use   Vaping Use: Never used  Substance and Sexual Activity   Alcohol use: Not Currently   Drug use: Never   Sexual activity: Yes    Partners: Male    Comment: 1st intercourse- 79, partners- 44, current partner- 3 yrs   Other Topics Concern   Not on file  Social History Narrative   Not on file   Social Determinants of Health   Financial Resource Strain: Not on file  Food Insecurity: Not on file  Transportation Needs: Not on file  Physical Activity: Not on file  Stress: Not on file  Social Connections: Not on file  Intimate Partner Violence: Not on file    Outpatient Medications Prior to Visit  Medication Sig Dispense Refill   Norethindrone  Acetate-Ethinyl Estrad-FE (LOESTRIN 24 FE) 1-20 MG-MCG(24) tablet Take 1 tablet by mouth daily. 84 tablet 4   diphenoxylate-atropine (LOMOTIL) 2.5-0.025 MG tablet Take 1 tablet by mouth 4 (four) times daily as needed for diarrhea or loose stools. (Patient not taking: Reported on 06/08/2021) 30 tablet 0   No facility-administered medications prior to visit.    No Known Allergies  Review of Systems  Constitutional:  Negative for activity change and appetite change.  HENT:  Negative for congestion.   Respiratory:  Negative for cough and chest tightness.   Cardiovascular:  Negative for chest pain, palpitations and leg swelling.  Gastrointestinal:  Positive for diarrhea.  Genitourinary: Negative.   Musculoskeletal: Negative.   Neurological: Negative.   Psychiatric/Behavioral: Negative.        Objective:    Physical Exam  BP 100/80   Pulse 85   Temp (!) 97.4 F (36.3 C)   Resp 16   Ht 5' 4.5" (1.638 m)   Wt 226 lb (102.5 kg)   LMP 06/05/2021 (Approximate) Comment: OCP for contraceptive.  SpO2 97%   BMI 38.19 kg/m  Wt Readings from Last 3 Encounters:  06/08/21 226 lb (102.5 kg)  05/25/21 225 lb (102.1 kg)  04/18/21 223 lb (101.2  kg)    Health Maintenance Due  Topic Date Due   TETANUS/TDAP  Never done   INFLUENZA VACCINE  05/09/2021    There are no preventive care reminders to display for this patient.   Lab Results  Component Value Date   TSH 2.110 07/20/2020   Lab Results  Component Value Date   WBC 7.4 01/18/2021   HGB 13.9 01/18/2021   HCT 42.0 01/18/2021   MCV 85 01/18/2021   PLT 378 01/18/2021   Lab Results  Component Value Date   NA 141 01/18/2021   K 4.8 01/18/2021   CO2 20 01/18/2021   GLUCOSE 87 01/18/2021   BUN 9 01/18/2021   CREATININE 0.71 01/18/2021   BILITOT 0.2 01/18/2021   ALKPHOS 68 01/18/2021   AST 24 01/18/2021   ALT 15 01/18/2021   PROT 7.3 01/18/2021   ALBUMIN 4.6 01/18/2021   CALCIUM 9.4 01/18/2021   EGFR 114 01/18/2021    Lab Results  Component Value Date   CHOL 182 01/18/2021   Lab Results  Component Value Date   HDL 64 01/18/2021   Lab Results  Component Value Date   LDLCALC 100 (H) 01/18/2021   Lab Results  Component Value Date   TRIG 99 01/18/2021   Lab Results  Component Value Date   CHOLHDL 2.8 01/18/2021   Lab Results  Component Value Date   HGBA1C 5.6 01/18/2021       Assessment & Plan:  1. Diarrhea of infectious origin - CBC with Differential/Platelet - Comprehensive metabolic panel - Sedimentation rate - Ambulatory referral to Gastroenterology Continued diarrhea with negative cultures, refer to gastroenterology , she is on lactose free diet  2. Gastroesophageal reflux disease with esophagitis without hemorrhage - omeprazole (PRILOSEC) 40 MG capsule; Take 1 capsule (40 mg total) by mouth daily.  Dispense: 30 capsule; Refill: 3 - H. pylori Screen - Ambulatory referral to Gastroenterology Plan of care was formulated today.  She is doing fair.  A plan of care was formulated using patient exam, tests and other sources to optimize care using evidence based information.  Recommend no smoking, no eating after supper, avoid fatty foods, elevate Head of bed, avoid tight fitting clothing.  Start on omeprazole 44m qd  3. Fatigue, unspecified type - TSH - Ambulatory referral to Gastroenterology Patient has fatigue and alopecia, positive family history of hyothyroidism    Meds ordered this encounter  Medications   omeprazole (PRILOSEC) 40 MG capsule    Sig: Take 1 capsule (40 mg total) by mouth daily.    Dispense:  30 capsule    Refill:  3    Orders Placed This Encounter  Procedures   CBC with Differential/Platelet   Comprehensive metabolic panel   TSH   Sedimentation rate   Ambulatory referral to Gastroenterology   H. pylori Screen     I spent 20 minutes dedicated to the care of this patient on the date of this encounter to include face-to-face time with the patient,  as well as:   Follow-up: Return in about 1 month (around 07/08/2021) for diarrhea.  An After Visit Summary was printed and given to the patient.  LReinaldo Meeker MD [facility] [phone]

## 2021-06-09 LAB — COMPREHENSIVE METABOLIC PANEL
ALT: 19 IU/L (ref 0–32)
AST: 24 IU/L (ref 0–40)
Albumin/Globulin Ratio: 1.9 (ref 1.2–2.2)
Albumin: 4.4 g/dL (ref 3.8–4.8)
Alkaline Phosphatase: 80 IU/L (ref 44–121)
BUN/Creatinine Ratio: 16 (ref 9–23)
BUN: 10 mg/dL (ref 6–20)
Bilirubin Total: 0.2 mg/dL (ref 0.0–1.2)
CO2: 22 mmol/L (ref 20–29)
Calcium: 9.6 mg/dL (ref 8.7–10.2)
Chloride: 101 mmol/L (ref 96–106)
Creatinine, Ser: 0.63 mg/dL (ref 0.57–1.00)
Globulin, Total: 2.3 g/dL (ref 1.5–4.5)
Glucose: 88 mg/dL (ref 65–99)
Potassium: 4.6 mmol/L (ref 3.5–5.2)
Sodium: 142 mmol/L (ref 134–144)
Total Protein: 6.7 g/dL (ref 6.0–8.5)
eGFR: 119 mL/min/{1.73_m2} (ref >59)

## 2021-06-09 LAB — CBC WITH DIFFERENTIAL/PLATELET
Basophils Absolute: 0.1 10*3/uL (ref 0.0–0.2)
Basos: 1 %
EOS (ABSOLUTE): 0.2 10*3/uL (ref 0.0–0.4)
Eos: 2 %
Hematocrit: 42.2 % (ref 34.0–46.6)
Hemoglobin: 13.8 g/dL (ref 11.1–15.9)
Immature Grans (Abs): 0 10*3/uL (ref 0.0–0.1)
Immature Granulocytes: 0 %
Lymphocytes Absolute: 2.5 10*3/uL (ref 0.7–3.1)
Lymphs: 32 %
MCH: 28.1 pg (ref 26.6–33.0)
MCHC: 32.7 g/dL (ref 31.5–35.7)
MCV: 86 fL (ref 79–97)
Monocytes Absolute: 0.5 10*3/uL (ref 0.1–0.9)
Monocytes: 6 %
Neutrophils Absolute: 4.5 10*3/uL (ref 1.4–7.0)
Neutrophils: 59 %
Platelets: 334 10*3/uL (ref 150–450)
RBC: 4.91 x10E6/uL (ref 3.77–5.28)
RDW: 13.5 % (ref 11.7–15.4)
WBC: 7.7 10*3/uL (ref 3.4–10.8)

## 2021-06-09 LAB — SEDIMENTATION RATE: Sed Rate: 16 mm/hr (ref 0–32)

## 2021-06-09 LAB — TSH: TSH: 2.62 u[IU]/mL (ref 0.450–4.500)

## 2021-06-09 NOTE — Progress Notes (Signed)
Cbc normal, kidney and liver tests normal, TSH normal 2.62, ESR 16 normal no inflammation lp

## 2021-06-10 LAB — CDIFF NAA+O+P+STOOL CULTURE
E coli, Shiga toxin Assay: NEGATIVE
Toxigenic C. Difficile by PCR: NEGATIVE

## 2021-06-12 NOTE — Progress Notes (Signed)
All stool tests negative lp

## 2021-06-20 ENCOUNTER — Other Ambulatory Visit: Payer: Self-pay | Admitting: Obstetrics & Gynecology

## 2021-07-12 ENCOUNTER — Encounter: Payer: Self-pay | Admitting: Nurse Practitioner

## 2021-07-12 ENCOUNTER — Ambulatory Visit (INDEPENDENT_AMBULATORY_CARE_PROVIDER_SITE_OTHER): Payer: 59 | Admitting: Nurse Practitioner

## 2021-07-12 VITALS — BP 98/68 | HR 85 | Ht 65.0 in | Wt 225.0 lb

## 2021-07-12 DIAGNOSIS — K2100 Gastro-esophageal reflux disease with esophagitis, without bleeding: Secondary | ICD-10-CM

## 2021-07-12 DIAGNOSIS — K580 Irritable bowel syndrome with diarrhea: Secondary | ICD-10-CM | POA: Diagnosis not present

## 2021-07-12 NOTE — Patient Instructions (Signed)
RECOMENDACIONES: Benefiber- 1 cucharada diaria. Probitico de bacterias [place] vez al da. Imodium 1 tableta al da segn sea necesario para la diarrea. Contine con Omeprazol 40 mg al da. Seguimiento en 6-8 semanas con el Dr. Silverio Decamp. Siga la dieta y la informacin sobre la [place].  Fue genial verte hoy! Gracias por confiarme su atencin y [place].  Noralyn Pick, CRNP  Opciones de alimentos para pacientes adultos con enfermedad de reflujo Social worker for Gastroesophageal Reflux Disease, Adult Si tiene enfermedad de reflujo gastroesofgico (ERGE), los alimentos que consume y los hbitos de alimentacin son Theatre stage manager. Elegir los alimentos adecuados puede ayudar a [employer]. Piense en consultar a un experto en alimentacin (nutricionista) para que lo ayude a Warehouse manager. Consejos para seguir Photographer las etiquetas de los alimentos Elija alimentos que tengan bajo contenido de grasas saturadas. Los alimentos que pueden ayudar con los sntomas incluyen los siguientes: Alimentos que tienen menos del 5 % de los valores diarios (VD) de grasa. Alimentos que tienen 0 gramos de grasas trans. Al cocinar No frer los alimentos. Cocinar la comida al horno, al vapor, a la plancha o en la parrilla. Estos son mtodos que no necesitan mucha grasa para Audiological scientist. Para agregar sabor, trate de consumir hierbas con bajo contenido de picante y [place]. Planificacin de las comidas  Elegir alimentos saludables con bajo contenido de [place], por ejemplo: Frutas y vegetales. Cereales integrales. Productos lcteos con bajo contenido de [place]. Lysbeth Galas, pescado y aves. Haga comidas pequeas frecuentes en lugar de 3 comidas abundantes al da. Coma lentamente, en un lugar donde est relajado. Evite agacharse o recostarse hasta 2 o 3 horas despus de haber comido. Limite los alimentos con alto contenido graso como las carnes  grasas o los alimentos fritos. Limite su ingesta de alimentos grasos, como aceites, [place] y [place]. Evite lo siguiente si el mdico se lo indica: Consumir alimentos que le ocasionen sntomas. Pueden ser distintos para cada persona. Lleve un registro de los alimentos para identificar aquellos que le causen sntomas. Alcohol. Beber mucha cantidad de lquido con las comidas. Comer 2 o 3 horas antes de acostarse. Estilo de vida Mantenga un peso saludable. Pregntele a su mdico cul es el peso saludable para usted. Si necesita perder peso, hable con su mdico para hacerlo de manera segura. Haga actividad fsica durante, al menos, 30 minutos 5 das por semana o ms, o como se lo haya indicado el mdico. Use ropa suelta. No fume ni consuma ningn producto que contenga nicotina o tabaco. Si necesita ayuda para dejar de fumar, consulte al mdico. Duerma con la cabecera de la cama ms elevada que los pies. Use una cua debajo del colchn o bloques debajo del armazn de la cama para Theatre manager la cabecera de la cama elevada. Mastique chicle sin azcar despus de las comidas. Qu alimentos debo comer? Siga una dieta saludable y bien equilibrada que incluya abundantes frutas, verduras, cereales integrales, productos lcteos descremados, carnes magras, pescado y aves. Cada persona es diferente. Los alimentos que pueden causar sntomas en una persona pueden no causar sntomas en otra persona. Trabaje con el mdico para hallar alimentos que sean seguros para usted. Es posible que los productos que se enumeran ms [place] no constituyan una lista completa de lo que puede comer y Electronics engineer. Pngase en contacto con un experto en alimentos para conocer ms opciones. Qu alimentos debo evitar? Limitar algunos de estos alimentos puede ayudar a Chief Technology Officer los sntomas de  ERGE. Cada persona es diferente. Consulte a un experto en alimentacin o a su mdico para que lo ayude a Pension scheme manager los alimentos exactos que debe  evitar, si los hubiere. Frutas Cualquier fruta que est preparada con grasa agregada. Cualquier fruta que le ocasione sntomas. Para algunas personas, estas pueden incluir, las frutas ctricas como naranja, pomelo, pia y limn. Verduras Verduras fritas en abundante aceite. Papas fritas. Cualquier verdura que est preparada con grasa agregada. Cualquier verdura que le ocasione sntomas. Para algunas personas, estas pueden incluir tomates y productos con tomate, [place], cebollas y [place], y rbanos picantes. Granos Pasteles o panes sin levadura con grasa agregada. Carnes y otras protenas Carnes de alto contenido graso como carne grasa de vaca o cerdo, salchichas, costillas, jamn, salchicha, salame y tocino. Carnes o protenas fritas, lo que incluye pescado frito y pollo frito. Frutos secos y Engineer, mining de frutos secos, en grandes cantidades. Lcteos Leche entera y [place] con chocolate. Phoebe Sharps. Crema. Helado. Queso crema. Batidos con [employer]. Grasas y [employer]. Margarina. Lardo. [place] clarificada. Bebidas Caf y t negro, con o sin cafena. Bebidas con gas. Refrescos. Bebidas energizantes. Jugo de fruta hecho con frutas cidas, como naranja o pomelo. Jugo de tomate. Bebidas alcohlicas. Dulces y postres Chocolate y cacao. [place]. Alios y condimentos [place]. Menta y mentol. Sal agregada. Cualquier condimento, hierbas o aderezos que le ocasionen sntomas. Para algunas personas, esto puede incluir curry, salsa picante o aderezos para ensalada a base de vinagre. Es posible que los productos que se enumeran ms arriba no constituyan una lista completa de lo que no debera comer y Electronics engineer. Pngase en contacto con un experto en alimentos para conocer ms opciones. Preguntas para hacerle al [employer] Los cambios en la dieta y en el estilo de vida a menudo son los primeros pasos que se toman para Air cabin crew los sntomas de [place]. Si los [employer] dieta y el estilo de vida no ayudan,  hable con el mdico sobre el uso de medicamentos. Dnde buscar ms informacin Control and instrumentation engineer for Gastrointestinal Disorders (Fundacin Internacional para los Trastornos Gastrointestinales): aboutgerd.org Resumen Si tiene [employer], las elecciones de alimentos y el [place] de vida son muy importantes para ayudar a Herbalist sntomas. Haga comidas pequeas durante Psychiatrist de 3 comidas abundantes. Coma lentamente y en un lugar donde est relajado. Evite agacharse o recostarse hasta 2 o 3 horas despus de haber comido. Limite los alimentos con alto contenido graso como las carnes grasas o los alimentos fritos. Esta informacin no tiene Marine scientist el consejo del mdico. Asegrese de hacerle al mdico cualquier pregunta que tenga. Document Revised: 05/07/2020 Document Reviewed: 05/07/2020 Elsevier Patient Education  [place].

## 2021-07-12 NOTE — Progress Notes (Signed)
07/12/2021 Monica Randall [id number] 01-05-1986   CHIEF COMPLAINT: Monica Randall is a 35 year old female with a past medical history of asthma as a child, kidney stones and GERD. S/P cholecystectomy 2012 and right oophorectomy secondary to a benign ovarian cyst.  She presents to our office today as referred by Dr. Purcell Nails for further evaluation regarding GERD and diarrhea.  She speaks Spanish therefore she is accompanied by a [place] translator facilitate communication throughout today's consult.  She reported having heartburn symptoms, primarily burning in her throat for 10 years which abated after she started taking Omeprazole 40 mg p.o. daily as prescribed by Dr. Reinaldo Meeker.  She denies having any dysphagia with pills or solid foods but describes having infrequent difficulty swallowing hot soup which briefly get stuck in her throat then passes within a few seconds.  No upper or lower abdominal pain.  She denies ever having an EGD.  She previously passed 2 normal formed brown bowel movements daily.  However, five months ago her bowel pattern changed.  She developed intermittent nonbloody mud like to watery diarrhea which occurs 3 times daily 3 to 4 days weekly.  She passes a solid stool 3 days weekly.  She intermittently takes Lomotil with slight improvement.  She passed a normal solid stool today.  She was last prescribed an antibiotic for pneumonia 2-1/2 months ago.  Her diarrhea did not improve or worsen after taking the antibiotic.  She is following a lactose-free diet.  She drinks 1 to 2 cups of decaffeinated coffee daily.  Stool cultures including C. difficile and O&P 05/2021 were negative. Labs on 06/08/2021: Sodium 142.  Potassium 4.6.  Glucose 88.  BUN 10.  Creatinine 0.63.  Alk phos 80.  AST 24.  ALT 19.  Total bili 0.2.  WBC 7.7.  Hemoglobin 13.8.  Hematocrit 42.2.  Platelet 334.  Sed rate 16.  TSH 2.620.  No known family history of IBD, esophageal, gastric or colon cancer.   Infrequent NSAID use.  Past Medical History:  Diagnosis Date   GERD (gastroesophageal reflux disease)    Kidney stones    Mild intermittent asthma 07/20/2020   as a child   Mixed hyperlipidemia 07/21/2020   Ovarian cyst    Prediabetes 07/21/2020   Past Surgical History:  Procedure Laterality Date   GALLBLADDER SURGERY  2012   KIDNEY STONE SURGERY  2018   Laser surgery   OOPHORECTOMY Right 2011  Prolonged sedation after D & C.   Social History: She is originally from [place].  Non-smoker.  No alcohol use  Family History: No known family history of esophageal, gastric or colon cancer.  Maternal grandfather with thyroid disease.  No Known Allergies   Outpatient Encounter Medications as of 07/12/2021  Medication Sig   omeprazole (PRILOSEC) 40 MG capsule Take 1 capsule (40 mg total) by mouth daily.   diphenoxylate-atropine (LOMOTIL) 2.5-0.025 MG tablet Take 1 tablet by mouth 4 (four) times daily as needed for diarrhea or loose stools. (Patient not taking: No sig reported)   Norethindrone Acetate-Ethinyl Estrad-FE (BLISOVI 24 FE) 1-20 MG-MCG(24) tablet TAKE 1 TABLET BY MOUTH DAILY (Patient not taking: Reported on 07/12/2021)   No facility-administered encounter medications on file as of 07/12/2021.   REVIEW OF SYSTEMS:  Gen: + Fatigue. Denies fever, sweats or chills. No weight loss.  CV: Denies chest pain, palpitations or edema. Resp: Denies cough, shortness of breath of hemoptysis.  GI: See HPI.   GU : Denies urinary burning,  blood in urine, increased urinary frequency or incontinence. MS: Denies joint pain, muscles aches or weakness. Derm: Denies rash, itchiness, skin lesions or unhealing ulcers. Psych: Denies depression, anxiety or memory loss. Heme: Denies bruising, bleeding. Neuro:  Denies headaches, dizziness or paresthesias. Endo:  Denies any problems with DM, thyroid or adrenal function.  PHYSICAL EXAM: BP 98/68   Pulse 85   Ht _0  (1.651 m)   Wt 225 lb (102.1  kg)   LMP 06/22/2021 (Approximate)   SpO2 96%   BMI 37.44 kg/m  General: 35 year old female in NAD.  Head: Normocephalic and atraumatic. Eyes:  Sclerae non-icteric, conjunctive pink. Ears: Normal auditory acuity. Mouth: Dentition intact. No ulcers or lesions.  Neck: Supple, no lymphadenopathy or thyromegaly.  Lungs: Clear bilaterally to auscultation without wheezes, crackles or rhonchi. Heart: Regular rate and rhythm. No murmur, rub or gallop appreciated.  Abdomen: Soft, non distended.  Very mild RLQ tenderness without rebound or guarding.  No masses. No hepatosplenomegaly. Normoactive bowel sounds x 4 quadrants.  Rectal: Deferred.  Musculoskeletal: Symmetrical with no gross deformities. Skin: Warm and dry. No rash or lesions on visible extremities. Extremities: No edema. Neurological: Alert oriented x 4, no focal deficits.  Psychological:  Alert and cooperative. Normal mood and affect.  ASSESSMENT AND PLAN:  53) 35 year old female with nonbloody diarrhea 3 to 4 days weekly x 5 months.  No associated abdominal pain.  She is passing solid stools at least 3 days weekly.  Negative stool culture and O&P.  Normal TSH, WBC and sed rate. -Benefiber 1 tablespoon daily to bulk up stool -Phillips bacteria probiotic 1 p.o. daily -Imodium 1 tab daily as needed -Consider SIBO breath test  -Diagnostic colonoscopy to rule out colitis if diarrhea symptoms persist or worsen   2) Reported history of GERD symptoms x 10 years. Dysphagia with hot soup only.  -Continue Omeprazole 40 mg daily -H. pylori stool antigen not checked today as patient is on PPI -Eventual EGD to rule out reflux esophagitis/Barrett's esophagus and H. Pylori -GERD diet  Patient was advised to follow-up in the office in 6 to 8 weeks to further discuss scheduling an EGD due to having chronic GERD symptoms which have recently improved after initiating Omeprazole 40 mg daily and potentially discuss scheduling a diagnostic colonoscopy  if diarrhea persists or worsens      CC:  Lillard Anes,*

## 2021-07-12 NOTE — Progress Notes (Signed)
Reviewed and agree with documentation and assessment and plan. K. Veena Jannat Rosemeyer , MD   

## 2021-08-30 ENCOUNTER — Ambulatory Visit: Payer: 59 | Admitting: Legal Medicine

## 2021-08-30 ENCOUNTER — Other Ambulatory Visit: Payer: Self-pay

## 2021-08-30 ENCOUNTER — Encounter: Payer: Self-pay | Admitting: Legal Medicine

## 2021-08-30 VITALS — BP 100/70 | HR 88 | Temp 97.5°F | Resp 16 | Ht 65.0 in | Wt 225.0 lb

## 2021-08-30 DIAGNOSIS — Z23 Encounter for immunization: Secondary | ICD-10-CM | POA: Diagnosis not present

## 2021-08-30 DIAGNOSIS — N644 Mastodynia: Secondary | ICD-10-CM | POA: Diagnosis not present

## 2021-08-30 NOTE — Progress Notes (Signed)
 Established Patient Office Visit  Subjective:  Patient ID: Monica Randall, female    DOB: 07/24/1986  Age: 35 y.o. MRN: [id number]  CC:  Chief Complaint  Patient presents with   Breat Pain    HPI Monica Randall presents for fibroadenoma breast.  Pain both breast.  She had gained weight. The ct mammogram shows diffuse thickening breast tissue.  No definite mass.  Past Medical History:  Diagnosis Date   GERD (gastroesophageal reflux disease)    Kidney stones    Mild intermittent asthma 07/20/2020   as a child   Mixed hyperlipidemia 07/21/2020   Ovarian cyst    Prediabetes 07/21/2020    Past Surgical History:  Procedure Laterality Date   GALLBLADDER SURGERY  2012   KIDNEY STONE SURGERY  2018   Laser surgery   OOPHORECTOMY Right 2011    Family History  Problem Relation Age of Onset   Thyroid disease Maternal Grandfather    Cancer Maternal Aunt        leukemia   Thyroid disease Other    Colon cancer Neg Hx     Social History   Socioeconomic History   Marital status: Married    Spouse name: Not on file   Number of children: Not on file   Years of education: Not on file   Highest education level: Not on file  Occupational History   Occupation: contracter  Tobacco Use   Smoking status: Never   Smokeless tobacco: Never  Vaping Use   Vaping Use: Never used  Substance and Sexual Activity   Alcohol use: Not Currently   Drug use: Never   Sexual activity: Yes    Partners: Male    Comment: 1st intercourse- 15, partners- 6, current partner- 3 yrs   Other Topics Concern   Not on file  Social History Narrative   Not on file   Social Determinants of Health   Financial Resource Strain: Not on file  Food Insecurity: Not on file  Transportation Needs: Not on file  Physical Activity: Not on file  Stress: Not on file  Social Connections: Not on file  Intimate Partner Violence: Not on file    Outpatient Medications Prior to Visit  Medication Sig Dispense Refill    omeprazole (PRILOSEC) 40 MG capsule Take 1 capsule (40 mg total) by mouth daily. 30 capsule 3   Norethindrone Acetate-Ethinyl Estrad-FE (BLISOVI 24 FE) 1-20 MG-MCG(24) tablet TAKE 1 TABLET BY MOUTH DAILY (Patient not taking: Reported on 07/12/2021) 84 tablet 1   diphenoxylate-atropine (LOMOTIL) 2.5-0.025 MG tablet Take 1 tablet by mouth 4 (four) times daily as needed for diarrhea or loose stools. (Patient not taking: No sig reported) 30 tablet 0   No facility-administered medications prior to visit.    No Known Allergies  ROS Review of Systems  Constitutional:  Negative for activity change and appetite change.  HENT:  Negative for congestion.   Eyes:  Negative for visual disturbance.  Respiratory:  Negative for chest tightness and shortness of breath.   Cardiovascular:  Negative for chest pain, palpitations and leg swelling.  Gastrointestinal:  Negative for abdominal distention and abdominal pain.  Genitourinary: Negative.   Musculoskeletal:  Negative for arthralgias and back pain.  Skin: Negative.   Psychiatric/Behavioral: Negative.       Objective:    Physical Exam Vitals reviewed.  Constitutional:      Appearance: Normal appearance.  Cardiovascular:     Rate and Rhythm: Normal rate and regular rhythm.       Pulses: Normal pulses.     Heart sounds: Normal heart sounds. No murmur heard.   No gallop.  Pulmonary:     Effort: Pulmonary effort is normal. No respiratory distress.     Breath sounds: Normal breath sounds. No wheezing.  Chest:     Chest wall: Tenderness present.  Breasts:    Tanner Score is 5.     Right: Mass present.     Left: Mass present.     Comments: Diffuse fibrocystic changes, no focal massess Neurological:     Mental Status: She is alert.    BP 100/70   Pulse 88   Temp (!) 97.5 F (36.4 C)   Resp 16   Ht 5' 5" (1.651 m)   Wt 225 lb (102.1 kg)   SpO2 98%   BMI 37.44 kg/m  Wt Readings from Last 3 Encounters:  08/30/21 225 lb (102.1 kg)   07/12/21 225 lb (102.1 kg)  06/08/21 226 lb (102.5 kg)     Health Maintenance Due  Topic Date Due   Pneumococcal Vaccine 59-36 Years old (1 - PCV) Never done   TETANUS/TDAP  Never done    There are no preventive care reminders to display for this patient.  Lab Results  Component Value Date   TSH 2.620 06/08/2021   Lab Results  Component Value Date   WBC 7.7 06/08/2021   HGB 13.8 06/08/2021   HCT 42.2 06/08/2021   MCV 86 06/08/2021   PLT 334 06/08/2021   Lab Results  Component Value Date   NA 142 06/08/2021   K 4.6 06/08/2021   CO2 22 06/08/2021   GLUCOSE 88 06/08/2021   BUN 10 06/08/2021   CREATININE 0.63 06/08/2021   BILITOT 0.2 06/08/2021   ALKPHOS 80 06/08/2021   AST 24 06/08/2021   ALT 19 06/08/2021   PROT 6.7 06/08/2021   ALBUMIN 4.4 06/08/2021   CALCIUM 9.6 06/08/2021   EGFR 119 06/08/2021   Lab Results  Component Value Date   CHOL 182 01/18/2021   Lab Results  Component Value Date   HDL 64 01/18/2021   Lab Results  Component Value Date   LDLCALC 100 (H) 01/18/2021   Lab Results  Component Value Date   TRIG 99 01/18/2021   Lab Results  Component Value Date   CHOLHDL 2.8 01/18/2021   Lab Results  Component Value Date   HGBA1C 5.6 01/18/2021      Assessment & Plan:   Problem List Items Addressed This Visit       Other   Mastalgia - Primary   Patient has mastalgia and this is diffuse.  Mammograms show dense fibrocystic changes.  Patient want to see surgeon    Follow-up: Return if symptoms worsen or fail to improve.    Reinaldo Meeker, MD

## 2021-11-24 ENCOUNTER — Encounter: Payer: Self-pay | Admitting: Legal Medicine

## 2021-11-24 ENCOUNTER — Ambulatory Visit (INDEPENDENT_AMBULATORY_CARE_PROVIDER_SITE_OTHER): Payer: Managed Care, Other (non HMO) | Admitting: Legal Medicine

## 2021-11-24 ENCOUNTER — Other Ambulatory Visit: Payer: Self-pay

## 2021-11-24 VITALS — BP 100/78 | HR 82 | Temp 98.5°F | Resp 15 | Ht 65.0 in | Wt 229.0 lb

## 2021-11-24 DIAGNOSIS — R922 Inconclusive mammogram: Secondary | ICD-10-CM

## 2021-11-24 DIAGNOSIS — N644 Mastodynia: Secondary | ICD-10-CM | POA: Diagnosis not present

## 2021-11-24 NOTE — Progress Notes (Signed)
Subjective:  Patient ID: Monica Randall, female    DOB: Jan 22, 1986  Age: 36 y.o. MRN: [id number]  Chief Complaint  Patient presents with   Breast Problem    HPI: patient still having pain in both breasts.  She saw surgeon at [facility] who tried to get MRI but [employer] refused.   Her sister in [place] had breast mass that had to be removed.  She requests MRI breast for this.  She has dense breast which are best evaluated with MRI Current Outpatient Medications on File Prior to Visit  Medication Sig Dispense Refill   Multiple Vitamins-Minerals (MULTIVIT/MULTIMINERAL ADULT PO) Take by mouth.     omeprazole (PRILOSEC) 40 MG capsule Take 1 capsule (40 mg total) by mouth daily. 30 capsule 3   Norethindrone Acetate-Ethinyl Estrad-FE (BLISOVI 24 FE) 1-20 MG-MCG(24) tablet TAKE 1 TABLET BY MOUTH DAILY (Patient not taking: Reported on 11/24/2021) 84 tablet 1   No current facility-administered medications on file prior to visit.      Past Medical History:  Diagnosis Date   GERD (gastroesophageal reflux disease)    Kidney stones    Mild intermittent asthma 07/20/2020   as a child   Mixed hyperlipidemia 07/21/2020   Ovarian cyst    Prediabetes 07/21/2020   Past Surgical History:  Procedure Laterality Date   GALLBLADDER SURGERY  2012   KIDNEY STONE SURGERY  2018   Laser surgery   OOPHORECTOMY Right 2011    Family History  Problem Relation Age of Onset   Thyroid disease Maternal Grandfather    Cancer Maternal Aunt        leukemia   Thyroid disease Other    Colon cancer Neg Hx    Social History   Socioeconomic History   Marital status: Married    Spouse name: Not on file   Number of children: Not on file   Years of education: Not on file   Highest education level: Not on file  Occupational History   Occupation: Diplomatic Services operational officer  Tobacco Use   Smoking status: Never   Smokeless tobacco: Never  Vaping Use   Vaping Use: Never used  Substance and Sexual Activity   Alcohol use: Not Currently    Drug use: Never   Sexual activity: Yes    Partners: Male    Comment: 1st intercourse- 18, partners- 57, current partner- 3 yrs   Other Topics Concern   Not on file  Social History Narrative   Not on file   Social Determinants of Health   Financial Resource Strain: Not on file  Food Insecurity: Not on file  Transportation Needs: Not on file  Physical Activity: Not on file  Stress: Not on file  Social Connections: Not on file    Review of Systems  Constitutional:  Negative for chills, fatigue and fever.  HENT:  Negative for congestion, ear pain and sore throat.   Respiratory:  Negative for cough and shortness of breath.   Cardiovascular:  Negative for chest pain and palpitations.  Gastrointestinal:  Negative for abdominal pain, constipation, diarrhea, nausea and vomiting.  Endocrine: Negative for polydipsia, polyphagia and polyuria.  Genitourinary:  Negative for difficulty urinating and dysuria.  Musculoskeletal:  Negative for arthralgias, back pain and myalgias.  Skin:  Negative for rash.  Neurological:  Negative for headaches.  Psychiatric/Behavioral:  Negative for dysphoric mood. The patient is not nervous/anxious.     Objective:  BP 100/78    Pulse 82    Temp 98.5 F (36.9 C)  Resp 15    Ht 5\' 5"  (1.651 m)    Wt 229 lb (103.9 kg)    SpO2 98%    BMI 38.11 kg/m   BP/Weight 11/24/2021 08/30/2021 27/04/8241  Systolic BP 353 614 98  Diastolic BP 78 70 68  Wt. (Lbs) 229 225 225  BMI 38.11 37.44 37.44    Physical Exam Vitals reviewed.  Constitutional:      General: She is not in acute distress.    Appearance: Normal appearance. She is obese.  HENT:     Right Ear: Tympanic membrane normal.     Left Ear: Tympanic membrane normal.     Nose: Nose normal.     Mouth/Throat:     Mouth: Mucous membranes are moist.     Pharynx: Oropharynx is clear.  Cardiovascular:     Rate and Rhythm: Normal rate and regular rhythm.     Pulses: Normal pulses.     Heart sounds: Normal  heart sounds. No murmur heard.   No gallop.  Pulmonary:     Effort: Pulmonary effort is normal. No respiratory distress.     Breath sounds: No wheezing.  Chest:     Chest wall: Tenderness present.  Breasts:    Breasts are symmetrical.     Right: Tenderness present.     Left: Tenderness present.    Abdominal:     General: Abdomen is flat. Bowel sounds are normal. There is no distension.     Palpations: Abdomen is soft.     Tenderness: There is no abdominal tenderness.  Musculoskeletal:        General: Normal range of motion.     Cervical back: Normal range of motion and neck supple.     Right lower leg: No edema.     Left lower leg: No edema.  Lymphadenopathy:     Upper Body:     Right upper body: No supraclavicular, axillary or pectoral adenopathy.     Left upper body: No supraclavicular, axillary or pectoral adenopathy.  Skin:    General: Skin is warm and dry.     Capillary Refill: Capillary refill takes less than 2 seconds.  Neurological:     General: No focal deficit present.     Mental Status: She is alert and oriented to person, place, and time. Mental status is at baseline.        Lab Results  Component Value Date   WBC 7.7 06/08/2021   HGB 13.8 06/08/2021   HCT 42.2 06/08/2021   PLT 334 06/08/2021   GLUCOSE 88 06/08/2021   CHOL 182 01/18/2021   TRIG 99 01/18/2021   HDL 64 01/18/2021   LDLCALC 100 (H) 01/18/2021   ALT 19 06/08/2021   AST 24 06/08/2021   NA 142 06/08/2021   K 4.6 06/08/2021   CL 101 06/08/2021   CREATININE 0.63 06/08/2021   BUN 10 06/08/2021   CO2 22 06/08/2021   TSH 2.620 06/08/2021   HGBA1C 5.6 01/18/2021      Assessment & Plan:   Problem List Items Addressed This Visit       Other   Dense breast tissue on mammogram - Primary   Relevant Orders   MR BREAST BILATERAL WO CONTRAST Patient needs MRI to determine the cause with dense breast   Other Visit Diagnoses     Breast pain       Relevant Orders   MR BREAST BILATERAL  WO CONTRAST MRI necessary to determine cause of breast pain Patient  will self pay     .    Orders Placed This Encounter  Procedures   MR BREAST BILATERAL WO CONTRAST     Follow-up: Return if symptoms worsen or fail to improve.  An After Visit Summary was printed and given to the patient.  Reinaldo Meeker, MD [facility] [phone]

## 2021-11-29 ENCOUNTER — Other Ambulatory Visit: Payer: Self-pay

## 2021-11-29 ENCOUNTER — Other Ambulatory Visit: Payer: Managed Care, Other (non HMO)

## 2021-12-01 ENCOUNTER — Ambulatory Visit (INDEPENDENT_AMBULATORY_CARE_PROVIDER_SITE_OTHER): Payer: Managed Care, Other (non HMO) | Admitting: Obstetrics & Gynecology

## 2021-12-01 ENCOUNTER — Other Ambulatory Visit: Payer: Self-pay

## 2021-12-01 ENCOUNTER — Encounter: Payer: Self-pay | Admitting: Obstetrics & Gynecology

## 2021-12-01 ENCOUNTER — Other Ambulatory Visit (HOSPITAL_COMMUNITY)
Admission: RE | Admit: 2021-12-01 | Discharge: 2021-12-01 | Disposition: A | Discharge status: Home / Self Care | Payer: Commercial Managed Care - HMO | Source: Ambulatory Visit | Attending: Obstetrics & Gynecology | Admitting: Obstetrics & Gynecology

## 2021-12-01 VITALS — BP 110/72 | HR 74 | Resp 16 | Ht 64.75 in | Wt 230.0 lb

## 2021-12-01 DIAGNOSIS — E6609 Other obesity due to excess calories: Secondary | ICD-10-CM | POA: Diagnosis not present

## 2021-12-01 DIAGNOSIS — Z6838 Body mass index (BMI) 38.0-38.9, adult: Secondary | ICD-10-CM

## 2021-12-01 DIAGNOSIS — R1032 Left lower quadrant pain: Secondary | ICD-10-CM | POA: Diagnosis not present

## 2021-12-01 DIAGNOSIS — Z01419 Encounter for gynecological examination (general) (routine) without abnormal findings: Secondary | ICD-10-CM | POA: Diagnosis present

## 2021-12-01 DIAGNOSIS — Z3041 Encounter for surveillance of contraceptive pills: Secondary | ICD-10-CM | POA: Diagnosis not present

## 2021-12-01 MED ORDER — BLISOVI 24 FE 1-20 MG-MCG(24) PO TABS: 1.0000 | ORAL_TABLET | Freq: Every day | ORAL | 4 refills | Status: DC

## 2021-12-01 NOTE — Progress Notes (Signed)
Monica Randall 04-10-1986 [id number]   History:    36 y.o.  [id number] Married   RP:  Established patient presenting for annual gyn exam    HPI: Was well on BCPs with Blisovi Fe 24 1/20, stopped because of bilateral breast tenderness.  The breast tenderness is not much changed.  Mammo Neg 12/2020.  Used condoms.  Would like to restart on BCPs.  Pap Neg 11/2020.  Pap reflex today.  No BTB.  No pelvic pain.  Urine/BMs normal.  BMI 38.57.  Needs to increase physical activities and lower calories/carbs.  Past medical history,surgical history, family history and social history were all reviewed and documented in the EPIC chart.  Gynecologic History Patient's last menstrual period was 11/21/2021 (exact date).  Obstetric History OB History  Gravida Para Term Preterm AB Living  2 0     2 0  SAB IAB Ectopic Multiple Live Births  2            # Outcome Date GA Lbr Len/2nd Weight Sex Delivery Anes PTL Lv  2 SAB           1 SAB              ROS: A ROS was performed and pertinent positives and negatives are included in the history.  GENERAL: No fevers or chills. HEENT: No change in vision, no earache, sore throat or sinus congestion. NECK: No pain or stiffness. CARDIOVASCULAR: No chest pain or pressure. No palpitations. PULMONARY: No shortness of breath, cough or wheeze. GASTROINTESTINAL: No abdominal pain, nausea, vomiting or diarrhea, melena or bright red blood per rectum. GENITOURINARY: No urinary frequency, urgency, hesitancy or dysuria. MUSCULOSKELETAL: No joint or muscle pain, no back pain, no recent trauma. DERMATOLOGIC: No rash, no itching, no lesions. ENDOCRINE: No polyuria, polydipsia, no heat or cold intolerance. No recent change in weight. HEMATOLOGICAL: No anemia or easy bruising or bleeding. NEUROLOGIC: No headache, seizures, numbness, tingling or weakness. PSYCHIATRIC: No depression, no loss of interest in normal activity or change in sleep pattern.     Exam:   BP 110/72    Pulse 74     Resp 16    Ht 5' 4.75" (1.645 m)    Wt 230 lb (104.3 kg)    LMP 11/21/2021 (Exact Date)    BMI 38.57 kg/m   Body mass index is 38.57 kg/m.  General appearance : Well developed well nourished female. No acute distress HEENT: Eyes: no retinal hemorrhage or exudates,  Neck supple, trachea midline, no carotid bruits, no thyroidmegaly Lungs: Clear to auscultation, no rhonchi or wheezes, or rib retractions  Heart: Regular rate and rhythm, no murmurs or gallops Breast:Examined in sitting and supine position were symmetrical in appearance, no palpable masses or tenderness,  no skin retraction, no nipple inversion, no nipple discharge, no skin discoloration, no axillary or supraclavicular lymphadenopathy Abdomen: no palpable masses or tenderness, no rebound or guarding Extremities: no edema or skin discoloration or tenderness  Pelvic: Vulva: Normal             Vagina: No gross lesions or discharge  Cervix: No gross lesions or discharge.  Pap reflex done.  Uterus  AV, normal size, shape and consistency, non-tender and mobile  Adnexa  Without masses or tenderness  Anus: Normal   Assessment/Plan:  36 y.o. female for annual exam   1. Encounter for routine gynecological examination with Papanicolaou smear of cervix Was well on BCPs with Blisovi Fe 24 1/20, stopped because of bilateral  breast tenderness.  The breast tenderness is not much changed.  Mammo Neg 12/2020.  Used condoms.  Would like to restart on BCPs.  Pap Neg 11/2020.  Pap reflex today.  No BTB.  No pelvic pain.  Urine/BMs normal.  BMI 38.57.  Needs to increase physical activities and lower calories/carbs. - Cytology - PAP( [place])  2. Encounter for surveillance of contraceptive pills Restart on Blisovi Fe 24 1/20 with next cycle.  Prescription sent to pharmacy.  3. Intermittent left lower quadrant abdominal pain Intermittent Left pelvic pain, probably ovulatory.  S/P Rt Oophorectomy.  F/U Pelvic US to further investigate. - US  Transvaginal Non-OB; Future  4. Class 2 obesity due to excess calories without serious comorbidity with body mass index (BMI) of 38.0 to 38.9 in adult Lower calorie/carb diet.  Increase fitness activities.  Other orders - Norethindrone Acetate-Ethinyl Estrad-FE (BLISOVI 24 FE) 1-20 MG-MCG(24) tablet; Take 1 tablet by mouth daily.   Princess Bruins MD, 10:46 AM 12/01/2021

## 2021-12-02 LAB — CYTOLOGY - PAP: Diagnosis: NEGATIVE

## 2021-12-16 ENCOUNTER — Other Ambulatory Visit: Payer: Self-pay

## 2021-12-16 ENCOUNTER — Institutional Professional Consult (permissible substitution): Payer: Managed Care, Other (non HMO) | Admitting: Plastic Surgery

## 2021-12-19 ENCOUNTER — Other Ambulatory Visit: Payer: Self-pay | Admitting: Legal Medicine

## 2021-12-19 DIAGNOSIS — K2100 Gastro-esophageal reflux disease with esophagitis, without bleeding: Secondary | ICD-10-CM

## 2021-12-26 NOTE — Progress Notes (Signed)
? ?Subjective:  ?Patient ID: Monica Randall, female    DOB: 1986-06-06  Age: 36 y.o. MRN: [id number] ? ?Chief Complaint  ?Patient presents with  ? Gastroesophageal Reflux  ? Hyperlipidemia  ? Prediabetes  ? ? ?HPI: chronic visit ?  ?GERD: Patient is taking Omeprazole 40 mg daily. ? ?PCOS stable no medicines- to get ultrasound GYN ? ?Prediabetes on diet last A1c 5.6 ? ?Obesity- goes to gym. We discussed Weight Watchers ?Current Outpatient Medications on File Prior to Visit  ?Medication Sig Dispense Refill  ? Multiple Vitamins-Minerals (MULTIVIT/MULTIMINERAL ADULT PO) Take by mouth.    ? Norethindrone Acetate-Ethinyl Estrad-FE (BLISOVI 24 FE) 1-20 MG-MCG(24) tablet Take 1 tablet by mouth daily. 84 tablet 4  ? ?No current facility-administered medications on file prior to visit.  ? ?Past Medical History:  ?Diagnosis Date  ? GERD (gastroesophageal reflux disease)   ? Kidney stones   ? Mild intermittent asthma 07/20/2020  ? as a child  ? Mixed hyperlipidemia 07/21/2020  ? Ovarian cyst   ? Prediabetes 07/21/2020  ? ?Past Surgical History:  ?Procedure Laterality Date  ? GALLBLADDER SURGERY  2012  ? KIDNEY STONE SURGERY  2018  ? Laser surgery  ? OOPHORECTOMY Right 2011  ?  ?Family History  ?Problem Relation Age of Onset  ? Thyroid disease Maternal Grandfather   ? Cancer Maternal Aunt   ?     leukemia  ? Thyroid disease Other   ? Colon cancer Neg Hx   ? ?Social History  ? ?Socioeconomic History  ? Marital status: Single  ?  Spouse name: Not on file  ? Number of children: Not on file  ? Years of education: Not on file  ? Highest education level: Not on file  ?Occupational History  ? Occupation: Diplomatic Services operational officer  ?Tobacco Use  ? Smoking status: Never  ? Smokeless tobacco: Never  ?Vaping Use  ? Vaping Use: Never used  ?Substance and Sexual Activity  ? Alcohol use: Yes  ?  Comment: occ  ? Drug use: Never  ? Sexual activity: Yes  ?  Partners: Male  ?  Birth control/protection: Condom  ?  Comment: 1st intercourse- 15, partners- 13  ?Other  Topics Concern  ? Not on file  ?Social History Narrative  ? Not on file  ? ?Social Determinants of Health  ? ?Financial Resource Strain: Not on file  ?Food Insecurity: Not on file  ?Transportation Needs: Not on file  ?Physical Activity: Not on file  ?Stress: Not on file  ?Social Connections: Not on file  ? ? ?Review of Systems  ?Constitutional:  Negative for chills, fatigue and fever.  ?HENT:  Negative for congestion, ear pain and sore throat.   ?Eyes:  Negative for visual disturbance.  ?Respiratory:  Negative for cough and shortness of breath.   ?Cardiovascular:  Negative for chest pain and palpitations.  ?Gastrointestinal:  Negative for abdominal pain, constipation, diarrhea, nausea and vomiting.  ?Endocrine: Negative for polydipsia, polyphagia and polyuria.  ?Genitourinary:  Negative for difficulty urinating and dysuria.  ?Musculoskeletal:  Negative for arthralgias, back pain and myalgias.  ?Skin:  Negative for rash.  ?Neurological:  Negative for headaches.  ?Psychiatric/Behavioral:  Negative for dysphoric mood. The patient is not nervous/anxious.   ? ? ?Objective:  ?BP 100/70   Pulse 88   Temp 98.6 ?F (37 ?C)   Resp 15   Ht 5' 4.75" (1.645 m)   Wt 231 lb (104.8 kg)   LMP 12/17/2021   SpO2 97%   BMI 38.74  kg/m?  ? ?BP/Weight 12/27/2021 12/01/2021 11/24/2021  ?Systolic BP 536 144 315  ?Diastolic BP 70 72 78  ?Wt. (Lbs) 231 230 229  ?BMI 38.74 38.57 38.11  ? ? ?Physical Exam ?Vitals reviewed.  ?Constitutional:   ?   General: She is not in acute distress. ?   Appearance: Normal appearance. She is obese.  ?HENT:  ?   Head: Normocephalic.  ?   Right Ear: Tympanic membrane normal.  ?   Left Ear: Tympanic membrane normal.  ?   Mouth/Throat:  ?   Mouth: Mucous membranes are moist.  ?   Pharynx: Oropharynx is clear.  ?Eyes:  ?   Extraocular Movements: Extraocular movements intact.  ?   Conjunctiva/sclera: Conjunctivae normal.  ?   Pupils: Pupils are equal, round, and reactive to light.  ?Cardiovascular:  ?   Rate and  Rhythm: Normal rate and regular rhythm.  ?   Pulses: Normal pulses.  ?   Heart sounds: Normal heart sounds. No murmur heard. ?  No gallop.  ?Pulmonary:  ?   Effort: Pulmonary effort is normal. No respiratory distress.  ?   Breath sounds: Normal breath sounds. No wheezing.  ?Abdominal:  ?   General: Abdomen is flat. Bowel sounds are normal. There is no distension.  ?   Tenderness: There is no abdominal tenderness.  ?Musculoskeletal:  ?   Cervical back: Normal range of motion and neck supple.  ?Skin: ?   General: Skin is warm.  ?   Capillary Refill: Capillary refill takes less than 2 seconds.  ?Neurological:  ?   General: No focal deficit present.  ?   Mental Status: She is alert and oriented to person, place, and time. Mental status is at baseline.  ?   Gait: Gait normal.  ?Psychiatric:     ?   Mood and Affect: Mood normal.     ?   Thought Content: Thought content normal.  ? ? ? ?  ? ?Lab Results  ?Component Value Date  ? WBC 7.7 06/08/2021  ? HGB 13.8 06/08/2021  ? HCT 42.2 06/08/2021  ? PLT 334 06/08/2021  ? GLUCOSE 88 06/08/2021  ? CHOL 182 01/18/2021  ? TRIG 99 01/18/2021  ? HDL 64 01/18/2021  ? LDLCALC 100 (H) 01/18/2021  ? ALT 19 06/08/2021  ? AST 24 06/08/2021  ? NA 142 06/08/2021  ? K 4.6 06/08/2021  ? CL 101 06/08/2021  ? CREATININE 0.63 06/08/2021  ? BUN 10 06/08/2021  ? CO2 22 06/08/2021  ? TSH 2.620 06/08/2021  ? HGBA1C 5.6 01/18/2021  ? ? ? ? ?Assessment & Plan:  ? ?Problem List Items Addressed This Visit   ? ?  ? Respiratory  ? Mild intermittent asthma - Primary ?Patient has mild intermittent asthma which has been stable she is presently not on any medications.  ?  ? Digestive  ? GERD (gastroesophageal reflux disease)  ? Relevant Medications  ? omeprazole (PRILOSEC) 40 MG capsule ?Plan of care was formulated today.  She is doing well.  A plan of care was formulated using patient exam, tests and other sources to optimize care using evidence based information.  Recommend no smoking, no eating after  supper, avoid fatty foods, elevate Head of bed, avoid tight fitting clothing.  Continue on omeprazole   ?  ? Other  ? Mixed hyperlipidemia  ? Relevant Orders  ? Comprehensive metabolic panel  ? Lipid panel  ? CBC with Differential/Platelet  ? TSH ?AN INDIVIDUAL CARE PLAN  for hyperlipidemia/ cholesterol was established and reinforced today.  The patient's status was assessed using clinical findings on exam, lab and other diagnostic tests. The patient's disease status was assessed based on evidence-based guidelines and found to be fair controlled. ?MEDICATIONS were reviewed. ?SELF MANAGEMENT GOALS have been discussed and patient's success at attaining the goal of low cholesterol was assessed. ?RECOMMENDATION given include regular exercise 3 days a week and low cholesterol/low fat diet. ?CLINICAL SUMMARY including written plan to identify barriers unique to the patient due to social or economic  reasons was discussed.   ? Prediabetes  ? Relevant Orders  ? Hemoglobin A1c ?Patient is on diet and exercise  ?. ? ? ? ?  ? ?Follow-up: Return in about 6 months (around 06/29/2022). ? ?An After Visit Summary was printed and given to the patient. ? ?Reinaldo Meeker, MD ?[facility] ?[phone]

## 2021-12-27 ENCOUNTER — Ambulatory Visit (INDEPENDENT_AMBULATORY_CARE_PROVIDER_SITE_OTHER): Payer: Managed Care, Other (non HMO) | Admitting: Legal Medicine

## 2021-12-27 ENCOUNTER — Encounter: Payer: Self-pay | Admitting: Legal Medicine

## 2021-12-27 ENCOUNTER — Other Ambulatory Visit: Payer: Self-pay

## 2021-12-27 VITALS — BP 100/70 | HR 88 | Temp 98.6°F | Resp 15 | Ht 64.75 in | Wt 231.0 lb

## 2021-12-27 DIAGNOSIS — J4520 Mild intermittent asthma, uncomplicated: Secondary | ICD-10-CM | POA: Diagnosis not present

## 2021-12-27 DIAGNOSIS — K2100 Gastro-esophageal reflux disease with esophagitis, without bleeding: Secondary | ICD-10-CM | POA: Diagnosis not present

## 2021-12-27 DIAGNOSIS — R7303 Prediabetes: Secondary | ICD-10-CM | POA: Diagnosis not present

## 2021-12-27 DIAGNOSIS — E782 Mixed hyperlipidemia: Secondary | ICD-10-CM

## 2021-12-27 MED ORDER — OMEPRAZOLE 40 MG PO CPDR: 40.0000 mg | DELAYED_RELEASE_CAPSULE | Freq: Every day | ORAL | 3 refills | Status: DC

## 2021-12-28 LAB — COMPREHENSIVE METABOLIC PANEL
ALT: 25 IU/L (ref 0–32)
AST: 25 IU/L (ref 0–40)
Albumin/Globulin Ratio: 2 (ref 1.2–2.2)
Albumin: 4.5 g/dL (ref 3.8–4.8)
Alkaline Phosphatase: 75 IU/L (ref 44–121)
BUN/Creatinine Ratio: 14 (ref 9–23)
BUN: 10 mg/dL (ref 6–20)
Bilirubin Total: 0.2 mg/dL (ref 0.0–1.2)
CO2: 23 mmol/L (ref 20–29)
Calcium: 9.7 mg/dL (ref 8.7–10.2)
Chloride: 105 mmol/L (ref 96–106)
Creatinine, Ser: 0.7 mg/dL (ref 0.57–1.00)
Globulin, Total: 2.3 g/dL (ref 1.5–4.5)
Glucose: 99 mg/dL (ref 70–99)
Potassium: 4.9 mmol/L (ref 3.5–5.2)
Sodium: 143 mmol/L (ref 134–144)
Total Protein: 6.8 g/dL (ref 6.0–8.5)
eGFR: 115 mL/min/{1.73_m2} (ref >59)

## 2021-12-28 LAB — CBC WITH DIFFERENTIAL/PLATELET
Basophils Absolute: 0.1 10*3/uL (ref 0.0–0.2)
Basos: 1 %
EOS (ABSOLUTE): 0.1 10*3/uL (ref 0.0–0.4)
Eos: 1 %
Hematocrit: 44.2 % (ref 34.0–46.6)
Hemoglobin: 14.4 g/dL (ref 11.1–15.9)
Immature Grans (Abs): 0 10*3/uL (ref 0.0–0.1)
Immature Granulocytes: 1 %
Lymphocytes Absolute: 2.7 10*3/uL (ref 0.7–3.1)
Lymphs: 37 %
MCH: 27.9 pg (ref 26.6–33.0)
MCHC: 32.6 g/dL (ref 31.5–35.7)
MCV: 86 fL (ref 79–97)
Monocytes Absolute: 0.5 10*3/uL (ref 0.1–0.9)
Monocytes: 7 %
Neutrophils Absolute: 3.9 10*3/uL (ref 1.4–7.0)
Neutrophils: 53 %
Platelets: 359 10*3/uL (ref 150–450)
RBC: 5.16 x10E6/uL (ref 3.77–5.28)
RDW: 13.1 % (ref 11.7–15.4)
WBC: 7.3 10*3/uL (ref 3.4–10.8)

## 2021-12-28 LAB — TSH: TSH: 2.52 u[IU]/mL (ref 0.450–4.500)

## 2021-12-28 LAB — CARDIOVASCULAR RISK ASSESSMENT

## 2021-12-28 LAB — LIPID PANEL
Chol/HDL Ratio: 3.8 ratio (ref 0.0–4.4)
Cholesterol, Total: 188 mg/dL (ref 100–199)
HDL: 50 mg/dL (ref >39)
LDL Chol Calc (NIH): 118 mg/dL — ABNORMAL HIGH (ref 0–99)
Triglycerides: 111 mg/dL (ref 0–149)
VLDL Cholesterol Cal: 20 mg/dL (ref 5–40)

## 2021-12-28 LAB — HEMOGLOBIN A1C
Est. average glucose Bld gHb Est-mCnc: 123 mg/dL
Hgb A1c MFr Bld: 5.9 % — ABNORMAL HIGH (ref 4.8–5.6)

## 2021-12-28 NOTE — Progress Notes (Signed)
A1c 5.9, LDL cholesterol high 118, kidney and liver tests ok, CBC normal, TSH 2.52 normal ,  ?lp

## 2021-12-30 ENCOUNTER — Ambulatory Visit
Admission: RE | Admit: 2021-12-30 | Discharge: 2021-12-30 | Disposition: A | Discharge status: Home / Self Care | Payer: Managed Care, Other (non HMO) | Source: Ambulatory Visit | Attending: Legal Medicine | Admitting: Legal Medicine

## 2021-12-30 ENCOUNTER — Other Ambulatory Visit: Payer: Self-pay

## 2021-12-30 DIAGNOSIS — N644 Mastodynia: Secondary | ICD-10-CM

## 2021-12-30 DIAGNOSIS — R922 Inconclusive mammogram: Secondary | ICD-10-CM

## 2021-12-30 MED ORDER — GADOBUTROL 1 MMOL/ML IV SOLN
10.0000 mL | Freq: Once | INTRAVENOUS | Status: AC | PRN
  Administered 2021-12-30: 10 mL via INTRAVENOUS

## 2022-01-01 NOTE — Progress Notes (Signed)
Normal mammogram ?lp

## 2022-01-04 ENCOUNTER — Ambulatory Visit: Payer: Managed Care, Other (non HMO) | Admitting: Nurse Practitioner

## 2022-01-04 ENCOUNTER — Encounter: Payer: Self-pay | Admitting: Nurse Practitioner

## 2022-01-04 VITALS — BP 102/70 | HR 77 | Ht 64.75 in | Wt 230.4 lb

## 2022-01-04 DIAGNOSIS — R1030 Lower abdominal pain, unspecified: Secondary | ICD-10-CM

## 2022-01-04 DIAGNOSIS — K2100 Gastro-esophageal reflux disease with esophagitis, without bleeding: Secondary | ICD-10-CM | POA: Diagnosis not present

## 2022-01-04 DIAGNOSIS — K580 Irritable bowel syndrome with diarrhea: Secondary | ICD-10-CM

## 2022-01-04 DIAGNOSIS — R1010 Upper abdominal pain, unspecified: Secondary | ICD-10-CM | POA: Diagnosis not present

## 2022-01-04 MED ORDER — FAMOTIDINE 20 MG PO TABS: 20.0000 mg | ORAL_TABLET | Freq: Every day | ORAL | 11 refills | Status: DC

## 2022-01-04 NOTE — Progress Notes (Signed)
? ? ?01/04/2022 ?Charma Igo ?[id number] ?[birth year] ? ? ?Chief Complaint: GERD, diarrhea  ? ?History of Present Illness: Monica Randall is a 36 year old female with a past medical history of asthma as a child, kidney stones and GERD. S/P cholecystectomy 2012 and right oophorectomy secondary to a benign ovarian cyst.  She presents to our office today for follow up regarding GERD and diarrhea.  She speaks Spanish therefore she is accompanied by a [place] translator facilitate communication throughout today's visit. I initially saw her in office 07/12/2021 for further evaluation regarding chronic GERD symptoms and diarrhea x 5 months. Stool cultures were negative. She was prescribed fiber, a probiotic, Imodium a GERD diet with instructions to continue Omeprazole '40mg'$  QD with recommendations to follow up in 6 to 8 weeks to schedule  EGD and possible colonoscopy. She wishes to schedule an EGD and colonoscopy at this time. She remains on Omeprazole '40mg'$  daily. She continues to have heartburn and intermittent stomach pains. Sometimes has difficulty swallowing liquids, takes 2 swallows to get liquid down. She continues to pass loose to soft stools and a few days weekly she has watery nonbloody diarrhea. No rectal bleeding or black stools.   ? ?  ? ?  Latest Ref Rng & Units 12/27/2021  ?  9:38 AM 06/08/2021  ?  9:23 AM 01/18/2021  ?  9:58 AM  ?CBC  ?WBC 3.4 - 10.8 x10E3/uL 7.3   7.7   7.4    ?Hemoglobin 11.1 - 15.9 g/dL 14.4   13.8   13.9    ?Hematocrit 34.0 - 46.6 % 44.2   42.2   42.0    ?Platelets 150 - 450 x10E3/uL 359   334   378    ?  ? ?  Latest Ref Rng & Units 12/27/2021  ?  9:38 AM 06/08/2021  ?  9:23 AM 01/18/2021  ?  9:58 AM  ?CMP  ?Glucose 70 - 99 mg/dL 99   88   87    ?BUN 6 - 20 mg/dL '10   10   9    '$ ?Creatinine 0.57 - 1.00 mg/dL 0.70   0.63   0.71    ?Sodium 134 - 144 mmol/L 143   142   141    ?Potassium 3.5 - 5.2 mmol/L 4.9   4.6   4.8    ?Chloride 96 - 106 mmol/L 105   101   102    ?CO2 20 - 29 mmol/L '23    22   20    '$ ?Calcium 8.7 - 10.2 mg/dL 9.7   9.6   9.4    ?Total Protein 6.0 - 8.5 g/dL 6.8   6.7   7.3    ?Total Bilirubin 0.0 - 1.2 mg/dL 0.2   0.2   0.2    ?Alkaline Phos 44 - 121 IU/L 75   80   68    ?AST 0 - 40 IU/L '25   24   24    '$ ?ALT 0 - 32 IU/L '25   19   15    '$ ?  ? ?Current Medications, Allergies, Past Medical History, Past Surgical History, Family History and Social History were reviewed in [employer] record. ? ? ?Review of Systems:   ?Constitutional: Negative for fever, sweats, chills or weight loss.  ?Respiratory: Negative for shortness of breath.   ?Cardiovascular: Negative for chest pain, palpitations and leg swelling.  ?Gastrointestinal: See HPI.  ?Musculoskeletal: Negative for back pain or muscle  aches.  ?Neurological: Negative for dizziness, headaches or paresthesias.  ? ? ?Physical Exam: ?LMP 12/17/2021  ?Uses condoms for birth control. ?General: 36 year old female in NAD. ?Head: Normocephalic and atraumatic. ?Eyes: No scleral icterus. Conjunctiva pink . ?Ears: Normal auditory acuity. ?Mouth: Dentition intact. No ulcers or lesions.  ?Lungs: Clear throughout to auscultation. ?Heart: Regular rate and rhythm, no murmur. ?Abdomen: Soft, nontender and nondistended. No masses or hepatomegaly. Normal bowel sounds x 4 quadrants.  ?Rectal: Deferred.  ?Musculoskeletal: Symmetrical with no gross deformities. ?Extremities: No edema. ?Neurological: Alert oriented x 4. No focal deficits.  ?Psychological: Alert and cooperative. Normal mood and affect ? ?Assessment and Recommendations: ? ?74) 36 year old female with GERD symptoms x 10 years, dysphagia with liquids only  ?-EGD benefits and risks discussed including risk with sedation, risk of bleeding, perforation and infection  ?-Continue Omeprazole '40mg'$  QD. Add Famotidine '20mg'$  one tab Q HS ?-GERD diet  ? ?2) Colonoscopy benefits and risks discussed including risk with sedation, risk of bleeding, perforation and infection  ?-Benefiber Q  D ? ?Patient will contact our office if her menstrual cycle is delayed prior to her procedure date and a beta HCG will be ordered prior to proceeding with an EGD/colonoscopy. ? ?Further recommendations to be determined after the above evaluation completed  ? ? ? ? ? ? ? ?

## 2022-01-04 NOTE — Patient Instructions (Signed)
You have been scheduled for a colonoscopy/EGD. Please follow the written instructions given to you at your visit today. ?Please pick up your prep supplies at the pharmacy within the next 1-3 days. ?If you use inhalers (even only as needed), please bring them with you on the day of your procedure. ? ?RECOMMENDATIONS: ? ?We have sent the following medication to your pharmacy for you to pick up at your convenience: Famotidine 20 MG at bedtime. ?Continue Benefiber. ?Continue Omeprazole 40 MG once a day. ?If your menstrual cycle is delayed prior to your procedure, please contact our office for a pregnancy test. ? ?Thank you for trusting me with your gastrointestinal care!   ? ?Noralyn Pick, CRNP ? ? ? ?BMI: ? ?If you are age 20 or older, your body mass index should be between 23-30. Your Body mass index is 38.63 kg/m?Marland Kitchen If this is out of the aforementioned range listed, please consider follow up with your Primary Care Provider. ? ?If you are age 47 or younger, your body mass index should be between 19-25. Your Body mass index is 38.63 kg/m?Marland Kitchen If this is out of the aformentioned range listed, please consider follow up with your Primary Care Provider.  ? ?MY CHART: ? ?The [facility] providers would like to encourage you to use [facility] to communicate with providers for non-urgent requests or questions.  Due to long hold times on the telephone, sending your provider a message by [facility] may be a faster and more efficient way to get a response.  Please allow 48 business hours for a response.  Please remember that this is for non-urgent requests.  ? ?

## 2022-01-05 ENCOUNTER — Ambulatory Visit (INDEPENDENT_AMBULATORY_CARE_PROVIDER_SITE_OTHER): Payer: Managed Care, Other (non HMO)

## 2022-01-05 ENCOUNTER — Other Ambulatory Visit: Payer: Self-pay

## 2022-01-05 ENCOUNTER — Ambulatory Visit: Payer: Managed Care, Other (non HMO) | Admitting: Plastic Surgery

## 2022-01-05 ENCOUNTER — Ambulatory Visit (INDEPENDENT_AMBULATORY_CARE_PROVIDER_SITE_OTHER): Payer: Managed Care, Other (non HMO) | Admitting: Obstetrics & Gynecology

## 2022-01-05 ENCOUNTER — Encounter: Payer: Self-pay | Admitting: Obstetrics & Gynecology

## 2022-01-05 VITALS — BP 118/76

## 2022-01-05 VITALS — BP 124/85 | HR 75 | Ht 65.0 in | Wt 230.6 lb

## 2022-01-05 DIAGNOSIS — M545 Low back pain, unspecified: Secondary | ICD-10-CM | POA: Diagnosis not present

## 2022-01-05 DIAGNOSIS — M546 Pain in thoracic spine: Secondary | ICD-10-CM

## 2022-01-05 DIAGNOSIS — M4004 Postural kyphosis, thoracic region: Secondary | ICD-10-CM

## 2022-01-05 DIAGNOSIS — N62 Hypertrophy of breast: Secondary | ICD-10-CM

## 2022-01-05 DIAGNOSIS — Z719 Counseling, unspecified: Secondary | ICD-10-CM

## 2022-01-05 DIAGNOSIS — E282 Polycystic ovarian syndrome: Secondary | ICD-10-CM | POA: Diagnosis not present

## 2022-01-05 DIAGNOSIS — R1032 Left lower quadrant pain: Secondary | ICD-10-CM

## 2022-01-05 MED ORDER — BLISOVI 24 FE 1-20 MG-MCG(24) PO TABS: 1.0000 | ORAL_TABLET | Freq: Every day | ORAL | 4 refills | Status: DC

## 2022-01-05 NOTE — Progress Notes (Signed)
? ? ?  Monica Randall [birth year] [id number] ? ? ?     36 y.o.  G2P0020  ? ?RP: Left pelvic pain/PCOS for Pelvic US ? ?HPI: Intermittent Left pelvic pain, probably ovulatory.  H/O PCOS. S/P Rt Oophorectomy. Stopped BCPs because of worries about bilateral breast tenderness in 2022.  No breast lump felt.  Had a Bilateral Mammo 01/03/2021 which was Negative. ? ? ?OB History  ?Gravida Para Term Preterm AB Living  ?2 0     2 0  ?SAB IAB Ectopic Multiple Live Births  ?2          ?  ?# Outcome Date GA Lbr Len/2nd Weight Sex Delivery Anes PTL Lv  ?2 SAB           ?1 SAB           ? ? ?Past medical history,surgical history, problem list, medications, allergies, family history and social history were all reviewed and documented in the EPIC chart. ? ? ?Directed ROS with pertinent positives and negatives documented in the history of present illness/assessment and plan. ? ?Exam: ? ?Vitals:  ? 01/05/22 1637  ?BP: 118/76  ? ?General appearance:  Normal ? ?Pelvic US today: T/V images.  Anteverted uterus normal in size and shape.  The uterus measures 9.01 x 5.11 x 4.11 cm.  Avascular anterior echogenic focus in the myometrium at the mid uterus measuring 0.3 x 0.2 cm with adjacent small cystic spaces compatible with adenomyosis.  Tri layered symmetrical endometrial lining measured at about 6.33 mm.  No obvious mass or thickening seen.  Right ovary is surgically absent.  Left ovary is mildly enlarged with multi follicle pattern compatible with history of PCOS (volume 19.73 cm3).  A dominant follicle is seen on the left ovary.  Positive perfusion to the left ovary.  No adnexal mass.  No free fluid in the pelvis. ? ? ?Assessment/Plan:  36 y.o. G2P0020  ? ?1. Intermittent left lower quadrant abdominal pain ?Intermittent Left pelvic pain, probably ovulatory.  H/O PCOS. S/P Rt Oophorectomy. Stopped BCPs because of worries about bilateral breast tenderness in 2022.  No breast lump felt.  Had a Bilateral Mammo 01/03/2021 which was Negative.  Pelvic  US findings thoroughly reviewed with patient.  Right ovary absent.  Left ovary with mild enlargement and multi follicle pattern compatible with PCOS.  No left adnexal mass and no free fluid in the pelvis.  Intermittent left lower quadrant pain probably secondary to PCOS with occasional ovulation. ?- US Transvaginal Non-OB; Future in 3 months ? ?2. PCOS (polycystic ovarian syndrome) ?Given patient's intermittent left pelvic pain with left ovary's appearance compatible with PCOS, decision to restart on birth control pills with Blisovi 24 FE 1/20.  No contraindication to birth control pills.  Risks and benefits thoroughly reviewed.  Usage known to patient.  Prescription sent to pharmacy. ?- US Transvaginal Non-OB; Future in 3 months ? ?Other orders ?- Norethindrone Acetate-Ethinyl Estrad-FE (BLISOVI 24 FE) 1-20 MG-MCG(24) tablet; Take 1 tablet by mouth daily.  ? ?Counseling on intermittent left lower quadrant abdominal pain and PCOS, management discussed, risks and benefits of birth control pills reviewed and documentation reviewed for more than 15 minutes. ? ?Princess Bruins MD, 5:25 PM 01/05/2022 ? ? ? ?  ?

## 2022-01-05 NOTE — Progress Notes (Signed)
Reviewed and agree with documentation and assessment and plan. K. Veena Panagiotis Oelkers , MD   

## 2022-01-05 NOTE — Progress Notes (Signed)
? ?Referring Provider ?Lillard Anes, MD ?[address] ?[address] ?[place],  [place] [address]  ? ?CC:  ?Chief Complaint  ?Patient presents with  ? Consult  ?   ?  ?   ? ?Monica Randall is an 36 y.o. female.  ?HPI: Patient presents to discuss breast reduction.  She has had years of back pain neck pain and shoulder pain related to her large breast.  She is tried over-the-counter medications, warm packs, cold packs supportive bras with little relief.  She also gets rashes beneath her breast that been refractory to over-the-counter treatments.  She is currently a D cup and would like to be around a C cup or a more proportional size.  She would like her areolas to be smaller as well.  She does not smoke and is not diabetic.  No previous breast biopsies or procedures.  She has not had any children.  She is considering having children in the future. ? ?No Known Allergies ? ?Outpatient Encounter Medications as of 01/05/2022  ?Medication Sig  ? famotidine (PEPCID) 20 MG tablet Take 1 tablet (20 mg total) by mouth at bedtime. (Patient not taking: Reported on 01/05/2022)  ? Multiple Vitamins-Minerals (MULTIVIT/MULTIMINERAL ADULT PO) Take by mouth.  ? omeprazole (PRILOSEC) 40 MG capsule Take 1 capsule (40 mg total) by mouth daily.  ? ?No facility-administered encounter medications on file as of 01/05/2022.  ?  ? ?Past Medical History:  ?Diagnosis Date  ? GERD (gastroesophageal reflux disease)   ? Kidney stones   ? Mild intermittent asthma 07/20/2020  ? as a child  ? Mixed hyperlipidemia 07/21/2020  ? Ovarian cyst   ? Prediabetes 07/21/2020  ? ? ?Past Surgical History:  ?Procedure Laterality Date  ? GALLBLADDER SURGERY  2012  ? KIDNEY STONE SURGERY  2018  ? Laser surgery  ? OOPHORECTOMY Right 2011  ? ? ?Family History  ?Problem Relation Age of Onset  ? Cancer Maternal Aunt   ?     leukemia  ? Thyroid disease Maternal Grandfather   ? Thyroid disease Other   ? ? ?Social History  ? ?Social History Narrative  ? Not on file  ?   ? ?Review of Systems ?General: Denies fevers, chills, weight loss ?CV: Denies chest pain, shortness of breath, palpitations ? ?Physical Exam ? ?  01/05/2022  ?  4:37 PM 01/05/2022  ? 10:31 AM 01/04/2022  ? 10:06 AM  ?Vitals with BMI  ?Height  '5\' 5"'$  5' 4.75"  ?Weight  230 lbs 10 oz 230 lbs 6 oz  ?BMI  38.37 38.62  ?Systolic 496 759 163  ?Diastolic 76 85 70  ?Pulse  75 77  ?  ?General:  No acute distress,  Alert and oriented, Non-Toxic, Normal speech and affect ?Breast: She has grade 3 ptosis.  Sternal notch to nipple is 34 cm bilaterally.  Nipple to fold is 16 cm bilaterally.  No obvious scars or masses. ? ?Assessment/Plan ?The patient has bilateral symptomatic macromastia.  She is a good candidate for a breast reduction.  She is interested in pursuing surgical treatment.  She has tried supportive garments and fitted bras with no relief.  The details of breast reduction surgery were discussed.  I explained the procedure in detail along the with the expected scars.  The risks were discussed in detail and include bleeding, infection, damage to surrounding structures, need for additional procedures, nipple loss, change in nipple sensation, persistent pain, contour irregularities and asymmetries.  I explained that breast feeding  is often not possible after breast reduction surgery.  We discussed the expected postoperative course with an overall recovery period of about 1 month.  She demonstrated full understanding of all risks.  We discussed her personal risk factors.  The patient is interested in pursuing surgical treatment. ? ?I anticipate approximately 900g of tissue removed from each side. ? ? ?Cindra Presume ?01/05/2022, 5:20 PM  ? ? ?  ?

## 2022-01-07 ENCOUNTER — Encounter: Payer: Self-pay | Admitting: Obstetrics & Gynecology

## 2022-01-10 ENCOUNTER — Telehealth: Payer: Self-pay | Admitting: Plastic Surgery

## 2022-01-10 NOTE — Telephone Encounter (Signed)
Called patient using interpreter services to explain that insurance has denied the authorization due to an exclusion on her policy so they will not cover it no matter what the reason for it. Patient will speak with her PCP and see what her next options are. Patient knows to call us back if she wishes to move forward with surgery on a cosmetic basis.   ?

## 2022-02-13 ENCOUNTER — Encounter: Payer: Self-pay | Admitting: Gastroenterology

## 2022-02-13 ENCOUNTER — Ambulatory Visit (AMBULATORY_SURGERY_CENTER): Payer: Commercial Managed Care - HMO | Admitting: Gastroenterology

## 2022-02-13 VITALS — BP 110/60 | HR 74 | Temp 98.9°F | Resp 13 | Ht 64.0 in | Wt 230.0 lb

## 2022-02-13 DIAGNOSIS — K2970 Gastritis, unspecified, without bleeding: Secondary | ICD-10-CM | POA: Diagnosis not present

## 2022-02-13 DIAGNOSIS — K529 Noninfective gastroenteritis and colitis, unspecified: Secondary | ICD-10-CM | POA: Diagnosis not present

## 2022-02-13 DIAGNOSIS — K2100 Gastro-esophageal reflux disease with esophagitis, without bleeding: Secondary | ICD-10-CM | POA: Diagnosis present

## 2022-02-13 DIAGNOSIS — K3189 Other diseases of stomach and duodenum: Secondary | ICD-10-CM | POA: Diagnosis not present

## 2022-02-13 DIAGNOSIS — K259 Gastric ulcer, unspecified as acute or chronic, without hemorrhage or perforation: Secondary | ICD-10-CM

## 2022-02-13 DIAGNOSIS — K648 Other hemorrhoids: Secondary | ICD-10-CM | POA: Diagnosis not present

## 2022-02-13 MED ORDER — SODIUM CHLORIDE 0.9 % IV SOLN: 500.0000 mL | Freq: Once | INTRAVENOUS | Status: DC

## 2022-02-13 NOTE — Progress Notes (Signed)
Pt in recovery with monitors in place, VSS. Report given to receiving RN. Bite guard was placed with pt awake to ensure comfort. No dental or soft tissue damage noted. 

## 2022-02-13 NOTE — Patient Instructions (Addendum)
Hemorroides ?Hemorrhoids ?Las hemorroides son venas inflamadas adentro o alrededor del recto o del ano. Hay dos tipos de hemorroides: ?Hemorroides internas. Se forman en las venas del interior del recto. Pueden abultarse hacia afuera, irritarse y doler. ?Hemorroides externas. Se producen en las venas externas del ano y pueden sentirse como un bulto o zona hinchada, dura y dolorosa cerca del ano. ?La mayor?a de las hemorroides no causan problemas graves y se Engineer, petroleum con tratamientos caseros [employer] cambios en la dieta y el estilo de vida. Si los tratamientos caseros no ayudan con los s?ntomas, se pueden realizar procedimientos para reducir o extirpar las hemorroides. ??Cu?les son las causas? ?La causa de esta afecci?n es el aumento de la presi?n en la zona anal. Esta presi?n puede ser causada por distintos factores, por ejemplo: ?Estre?imiento. ?Hacer un gran esfuerzo para defecar. ?Diarrea. ?Embarazo. ?Obesidad. ?Estar sentado durante largos per?odos de tiempo. ?Levantar objetos pesados u otras actividades que impliquen esfuerzo. ?Sexo anal. ?Andar en bicicleta por un largo per?odo de tiempo. ??Cu?les son los signos o los s?ntomas? ?Los s?ntomas de esta afecci?n incluyen: ?Dolor. ?Picaz?n o irritaci?n anal. ?Sangrado rectal. ?P?rdida de materia fecal (heces). ?Inflamaci?n anal. ?Uno o m?s bultos alrededor del ano. ??C?mo se diagnostica? ?Esta afecci?n se diagnostica frecuentemente a trav?s de un examen visual. Posiblemente le realicen otros tipos de pruebas o estudios, como los siguientes: ?Un examen que implica palpar el ?rea rectal con la mano enguantada (examen rectal digital). ?Un examen del canal anal que se realiza utilizando un peque?o tubo (anoscopio). ?An?lisis de sangre si ha perdido [place] cantidad significativa de [place]. ?Una prueba que consiste en la observaci?n del interior del colon utilizando un tubo flexible con una c?mara en el extremo (sigmoidoscopia o colonoscop?a). ??C?mo se trata? ?Esta  afecci?n generalmente se puede tratar en el hogar. Sin embargo, se pueden TEFL teacher procedimientos si los cambios en la dieta, en el estilo de vida y otros tratamientos caseros no Runner, broadcasting/film/video los s?ntomas. Estos procedimientos pueden ayudar a reducir o [place] hemorroides completamente. Algunos de estos procedimientos son quir?rgicos y otros no. Algunos de los procedimientos m?s frecuentes son los siguientes: ?Ligadura con banda el?stica. Las bandas el?sticas se colocan en la base de las hemorroides para interrumpir su irrigaci?n de [place]. ?Escleroterapia. Se inyecta un medicamento en las hemorroides para reducir su tama?o. ?Coagulaci?n con luz infrarroja. Se utiliza un tipo de energ?a lum?nica para eliminar las hemorroides. ?Hemorroidectom?a. Las hemorroides se extirpan con cirug?a y las venas que las irrigan se atan. ?Hemorroidopexia con grapas. El cirujano engrapa la base de las hemorroides a la pared del recto. ?Siga estas instrucciones en su casa: ?Comida y bebida ? ?Consuma alimentos con alto contenido de [place], como cereales integrales, porotos, frutos secos, frutas y verduras. ?Preg?ntele a su m?dico acerca de tomar productos con fibra a?adida en ellos (complementos de fibra). ?Disminuya la cantidad de grasa de la dieta. Esto se puede lograr consumiendo productos l?cteos con bajo contenido de grasas, ingiriendo menor cantidad de carnes rojas y evitando los alimentos procesados. ?Beba suficiente l?quido como para mantener la orina de color amarillo p?lido. ?Control del dolor y la hinchaz?n ? ?Tome ba?os de asiento tibios durante 20 minutos, 3 o 4 veces por d?a para Glass blower/designer y las [place]. Puede hacer esto en una ba?era o usar un dispositivo port?til para ba?o de asiento que se coloca sobre el inodoro. ?Si se lo indican, aplique hielo en la zona afectada. Usar compresas de [employer] ba?os de asiento puede ser  efectivo. ?Ponga el hielo en una bolsa pl?stica. ?Coloque una [employer]  piel y Therapist, nutritional. ?Aplique el hielo durante 20 minutos, 2 o 3 veces por d?a. ?Instrucciones generales ?Use los medicamentos de venta libre y los recetados solamente como se lo haya indicado el m?dico. ?Apl?quese los medicamentos, cremas o supositorios como se lo hayan indicado. ?Haga ejercicio con regularidad. Consulte al m?dico qu? cantidad y qu? tipo de ejercicio es mejor para usted. En general, debe realizar al menos 30 minutos de ejercicio moderado la mayor?a de los d?as de la semana (150 minutos cada semana). Esto puede incluir [employer], andar en bicicleta o practicar yoga. ?Vaya al ba?o cuando sienta la necesidad de Landscape architect. No espere. ?Evite hacer fuerza en las deposiciones. ?Mantenga la zona anal limpia y seca. Use papel higi?nico h?medo o toallitas humedecidas despu?s de las deposiciones. ?No pase mucho tiempo sentado en el inodoro. Esto aumenta la afluencia de sangre y Conservation officer, historic buildings. ?Concurra a todas las visitas de seguimiento como se lo haya indicado el m?dico. Esto es importante. ?Comun?quese con un m?dico si tiene: ?Aumento del dolor y la hinchaz?n que no puede controlar con medicamentos o Clinical research associate. ?No puede defecar o lo hace con dificultad. ?Dolor o tiene inflamaci?n fuera de la zona de las hemorroides. ?Solicite ayuda de inmediato si tiene: ?Hemorragia descontrolada en el recto. ?Resumen ?Las hemorroides son venas inflamadas adentro o alrededor del recto o del ano. ?La mayor?a de las hemorroides se pueden controlar con tratamientos caseros como cambios en la dieta y el estilo de vida. ?Tomar ba?os de asiento con agua tibia puede ayudar a Best boy y las [place]. ?En los casos graves, se pueden realizar procedimientos o Ardelia Mems cirug?a para reducir o [place] hemorroides. ?Esta informaci?n no tiene Marine scientist el consejo del m?dico. Aseg?rese de hacerle al m?dico cualquier pregunta que tenga. ?Document Revised: 05/03/2021 Document Reviewed: 05/03/2021 ?Elsevier Patient  Education ? [place]. ?USTED TUVO [place] [facility]?PICO HOY EN EL [place] [facility]:   Lea el informe del procedimiento que se le entreg? para cualquier pregunta espec?fica sobre lo que se encontr? Education administrator.  Si el informe del examen no responde a sus preguntas, por favor llame a su gastroenter?logo para aclararlo.  Si usted solicit? que no se le den [employer] de lo que se Estate manager/land agent? en su procedimiento al acompa?ante que le va a cuidar, entonces el informe del procedimiento se ha incluido en un sobre sellado para que usted lo revise despu?s cuando le sea m?s conveniente. ?  ?LO QUE PUEDE ESPERAR: Algunas sensaciones de hinchaz?n en el abdomen.  Puede tener m?s gases de lo normal.  El caminar puede ayudarle a eliminar el aire que se le puso en el tracto gastrointestinal durante el procedimiento y reducir la hinchaz?n.  Si le hicieron una endoscopia inferior (como una colonoscopia o una sigmoidoscopia flexible), podr?a notar manchas de sangre en las heces fecales o en el papel higi?nico.  Si se someti? a una preparaci?n intestinal para su procedimiento, es posible que no tenga una evacuaci?n intestinal normal durante algunos d?as. ?  ?Tenga en cuenta:  Es posible que note un poco de irritaci?n y congesti?n en la nariz o alg?n drenaje.  Esto es debido al ox?geno utilizado durante su procedimiento.  No hay que preocuparse y esto debe desaparecer m?s o menos en un d?a. ?  ?S?NTOMAS PARA REPORTAR INMEDIATAMENTE: ? Despu?s de una endoscopia inferior (colonoscopia o sigmoidoscopia flexible): ? Cantidades excesivas de sangre en  las heces fecales ? Sensibilidad significativa o empeoramiento de los dolores abdominales  ? Hinchaz?n aguda del abdomen que antes no ten?a  ? Fiebre de 100?F o m?s ?  ?Despu?s de la endoscopia superior (EGD) ? V?mitos de sangre o material como caf? molido  ? Dolor en el pecho o dolor debajo de los om?platos que antes no ten?a  ? Dolor o dificultad persistente para  tragar ? Falta de aire que antes no ten?a  ? Fiebre de 100?F o m?s ? Heces fecales negras y pegajosas ?  ?Para asuntos urgentes o de emergencia, puede comunicarse con un gastroenter?logo a cualquier hora l

## 2022-02-13 NOTE — Progress Notes (Signed)
Interpreter used today at the [facility] for this pt.  Interpreter's name is- Verdis Frederickson  ? ?VS by DT ? ?Pt's states no medical or surgical changes since previsit or office visit. ? ?

## 2022-02-13 NOTE — Op Note (Signed)
[place] ?Patient Name: Monica Randall ?Procedure Date: 02/13/2022 1:57 PM ?MRN: [id number] ?Endoscopist: Mauri Pole , MD ?Age: 36 ?Referring MD:  ?Date of Birth: 25-Feb-1986 ?Gender: Female ?Account #: [id number] ?Procedure:                Upper GI endoscopy ?Indications:              Esophageal reflux symptoms that persist despite  ?                          appropriate therapy ?Medicines:                Monitored Anesthesia Care ?Procedure:                Pre-Anesthesia Assessment: ?                          - Prior to the procedure, a History and Physical  ?                          was performed, and patient medications and  ?                          allergies were reviewed. The patient's tolerance of  ?                          previous anesthesia was also reviewed. The risks  ?                          and benefits of the procedure and the sedation  ?                          options and risks were discussed with the patient.  ?                          All questions were answered, and informed consent  ?                          was obtained. Prior Anticoagulants: The patient has  ?                          taken no previous anticoagulant or antiplatelet  ?                          agents. ASA Grade Assessment: II - A patient with  ?                          mild systemic disease. After reviewing the risks  ?                          and benefits, the patient was deemed in  ?                          satisfactory condition to undergo the procedure. ?  After obtaining informed consent, the endoscope was  ?                          passed under direct vision. Throughout the  ?                          procedure, the patient's blood pressure, pulse, and  ?                          oxygen saturations were monitored continuously. The  ?                          Endoscope was introduced through the mouth, and  ?                          advanced to the second part of  duodenum. The upper  ?                          GI endoscopy was accomplished without difficulty.  ?                          The patient tolerated the procedure well. ?Scope In: ?Scope Out: ?Findings:                 LA Grade C (one or more mucosal breaks continuous  ?                          between tops of 2 or more mucosal folds, less than  ?                          75% circumference) esophagitis with no bleeding was  ?                          found 35 to 36 cm from the incisors. ?                          The exam of the esophagus was otherwise normal. ?                          Few non-bleeding superficial gastric ulcers were  ?                          found in the prepyloric region of the stomach. The  ?                          largest lesion was 4 mm in largest dimension.  ?                          Biopsies were taken with a cold forceps for  ?                          Helicobacter pylori testing. ?  Patchy mild inflammation characterized by  ?                          congestion (edema), erosions and erythema was found  ?                          in the entire examined stomach. Biopsies were taken  ?                          with a cold forceps for Helicobacter pylori testing. ?                          The examined duodenum was normal. ?Complications:            No immediate complications. ?Estimated Blood Loss:     Estimated blood loss was minimal. ?Impression:               - LA Grade C reflux esophagitis with no bleeding. ?                          - Non-bleeding gastric ulcers. Biopsied. ?                          - Gastritis. Biopsied. ?                          - Normal examined duodenum. ?Recommendation:           - Patient has a contact number available for  ?                          emergencies. The signs and symptoms of potential  ?                          delayed complications were discussed with the  ?                          patient. Return to normal activities  tomorrow.  ?                          Written discharge instructions were provided to the  ?                          patient. ?                          - Resume previous diet. ?                          - Continue present medications. ?                          - Await pathology results. ?                          - Follow an antireflux regimen. ?                          -  Use Prilosec (omeprazole) 40 mg PO BID for 2  ?                          months. ?                          - No aspirin, ibuprofen, naproxen, or other  ?                          non-steroidal anti-inflammatory drugs. ?                          - Return to GI office at the next available  ?                          appointment in 1-2 months. ?Mauri Pole, MD ?02/13/2022 2:55:20 PM ?This report has been signed electronically. ?

## 2022-02-13 NOTE — Progress Notes (Signed)
Called to room to assist during endoscopic procedure.  Patient ID and intended procedure confirmed with present staff. Received instructions for my participation in the procedure from the performing physician.  

## 2022-02-13 NOTE — Progress Notes (Signed)
[facility] History and Physical ? ? ?Primary Care Physician:  Lillard Anes, MD ? ? ?Reason for Procedure:  GERD symptoms despite therapy, diarrhea ? ?Plan:    EGD and colonoscopy with possible interventions as needed ? ? ? ? ?HPI: Monica Randall is a very pleasant 36 y.o. female here for EGD and colonoscopy for evaluation of persistent GERD symptoms despite therapy and chronic diarrhea. ? ? ?The risks and benefits as well as alternatives of endoscopic procedure(s) have been discussed and reviewed. All questions answered. The patient agrees to proceed. ? ? ? ?Past Medical History:  ?Diagnosis Date  ? GERD (gastroesophageal reflux disease)   ? Kidney stones   ? Mild intermittent asthma 07/20/2020  ? as a child  ? Mixed hyperlipidemia 07/21/2020  ? Ovarian cyst   ? Prediabetes 07/21/2020  ? ? ?Past Surgical History:  ?Procedure Laterality Date  ? GALLBLADDER SURGERY  2012  ? KIDNEY STONE SURGERY  2018  ? Laser surgery  ? OOPHORECTOMY Right 2011  ? ? ?Prior to Admission medications   ?Medication Sig Start Date End Date Taking? Authorizing Provider  ?famotidine (PEPCID) 20 MG tablet Take 1 tablet (20 mg total) by mouth at bedtime. ?Patient not taking: Reported on 01/05/2022 01/04/22   Noralyn Pick, NP  ?Multiple Vitamins-Minerals (MULTIVIT/MULTIMINERAL ADULT PO) Take by mouth.    [provider]  ?Norethindrone Acetate-Ethinyl Estrad-FE (BLISOVI 24 FE) 1-20 MG-MCG(24) tablet Take 1 tablet by mouth daily. 01/05/22   Princess Bruins, MD  ?omeprazole (PRILOSEC) 40 MG capsule Take 1 capsule (40 mg total) by mouth daily. 12/27/21   Lillard Anes, MD  ? ? ?Current Outpatient Medications  ?Medication Sig Dispense Refill  ? famotidine (PEPCID) 20 MG tablet Take 1 tablet (20 mg total) by mouth at bedtime. (Patient not taking: Reported on 01/05/2022) 30 tablet 11  ? Multiple Vitamins-Minerals (MULTIVIT/MULTIMINERAL ADULT PO) Take by mouth.    ? Norethindrone Acetate-Ethinyl  Estrad-FE (BLISOVI 24 FE) 1-20 MG-MCG(24) tablet Take 1 tablet by mouth daily. 84 tablet 4  ? omeprazole (PRILOSEC) 40 MG capsule Take 1 capsule (40 mg total) by mouth daily. 30 capsule 3  ? ?Current Facility-Administered Medications  ?Medication Dose Route Frequency Provider Last Rate Last Admin  ? 0.9 %  sodium chloride infusion  500 mL Intravenous Once Marleena Shubert, Venia Minks, MD      ? ? ?Allergies as of 02/13/2022  ? (No Known Allergies)  ? ? ?Family History  ?Problem Relation Age of Onset  ? Cancer Maternal Aunt   ?     leukemia  ? Thyroid disease Maternal Grandfather   ? Thyroid disease Other   ? ? ?Social History  ? ?Socioeconomic History  ? Marital status: Single  ?  Spouse name: Not on file  ? Number of children: Not on file  ? Years of education: Not on file  ? Highest education level: Not on file  ?Occupational History  ? Occupation: Diplomatic Services operational officer  ?Tobacco Use  ? Smoking status: Never  ? Smokeless tobacco: Never  ?Vaping Use  ? Vaping Use: Never used  ?Substance and Sexual Activity  ? Alcohol use: Yes  ?  Comment: occ  ? Drug use: Never  ? Sexual activity: Yes  ?  Partners: Male  ?  Birth control/protection: Condom  ?  Comment: 1st intercourse- 15, partners- 66  ?Other Topics Concern  ? Not on file  ?Social History Narrative  ? Not on file  ? ?Social Determinants of Health  ? ?Financial  Resource Strain: Not on file  ?Food Insecurity: Not on file  ?Transportation Needs: Not on file  ?Physical Activity: Not on file  ?Stress: Not on file  ?Social Connections: Not on file  ?Intimate Partner Violence: Not on file  ? ? ?Review of Systems: ? ?All other review of systems negative except as mentioned in the HPI. ? ?Physical Exam: ?Vital signs in last 24 hours: ?BP 140/64   Pulse 80   Temp 98.9 ?F (37.2 ?C) (Skin)   Ht '5\' 4"'$  (1.626 m)   Wt 230 lb (104.3 kg)   SpO2 99%   BMI 39.48 kg/m?  ?General:   Alert, NAD ?Lungs:  Clear .   ?Heart:  Regular rate and rhythm ?Abdomen:  Soft, nontender and  nondistended. ?Neuro/Psych:  Alert and cooperative. Normal mood and affect. A and O x 3 ? ?Reviewed labs, radiology imaging, old records and pertinent past GI work up ? ?Patient is appropriate for planned procedure(s) and anesthesia in an ambulatory setting ? ? ?K. Denzil Magnuson , MD ?[phone]  ? ? ?  ?

## 2022-02-13 NOTE — Op Note (Signed)
Monica Randall ?Patient Name: Monica Randall ?Procedure Date: 02/13/2022 1:49 PM ?MRN: [id number] ?Endoscopist: Mauri Pole , MD ?Age: 36 ?Referring MD:  ?Date of Birth: June 23, 1986 ?Gender: Female ?Account #: [id number] ?Procedure:                Colonoscopy ?Indications:              Clinically significant diarrhea of unexplained  ?                          origin ?Medicines:                Monitored Anesthesia Care ?Procedure:                Pre-Anesthesia Assessment: ?                          - Prior to the procedure, a History and Physical  ?                          was performed, and patient medications and  ?                          allergies were reviewed. The patient's tolerance of  ?                          previous anesthesia was also reviewed. The risks  ?                          and benefits of the procedure and the sedation  ?                          options and risks were discussed with the patient.  ?                          All questions were answered, and informed consent  ?                          was obtained. Prior Anticoagulants: The patient has  ?                          taken no previous anticoagulant or antiplatelet  ?                          agents. ASA Grade Assessment: II - A patient with  ?                          mild systemic disease. After reviewing the risks  ?                          and benefits, the patient was deemed in  ?                          satisfactory condition to undergo the procedure. ?  After obtaining informed consent, the colonoscope  ?                          was passed under direct vision. Throughout the  ?                          procedure, the patient's blood pressure, pulse, and  ?                          oxygen saturations were monitored continuously. The  ?                          Olympus PCF-H190DL (#[id number]) Colonoscope was  ?                          introduced through the anus and advanced to the the  ?                           cecum, identified by appendiceal orifice and  ?                          ileocecal valve. The colonoscopy was performed  ?                          without difficulty. The patient tolerated the  ?                          procedure well. The quality of the bowel  ?                          preparation was adequate. The terminal ileum,  ?                          ileocecal valve, appendiceal orifice, and rectum  ?                          were photographed. ?Scope In: 2:28:09 PM ?Scope Out: 2:42:14 PM ?Scope Withdrawal Time: 0 hours 10 minutes 10 seconds  ?Total Procedure Duration: 0 hours 14 minutes 5 seconds  ?Findings:                 The perianal and digital rectal examinations were  ?                          normal. Weak anal sphincter ?                          The terminal ileum appeared normal. ?                          Normal mucosa was found in the entire colon.  ?                          Biopsies for histology were taken with a cold  ?  forceps from the right colon and left colon for  ?                          evaluation of microscopic colitis. ?                          Non-bleeding external and internal hemorrhoids were  ?                          found during retroflexion. The hemorrhoids were  ?                          small. ?Complications:            No immediate complications. ?Estimated Blood Loss:     Estimated blood loss was minimal. ?Impression:               - The examined portion of the ileum was normal. ?                          - Normal mucosa in the entire examined colon.  ?                          Biopsied. ?                          - Non-bleeding external and internal hemorrhoids. ?Recommendation:           - Patient has a contact number available for  ?                          emergencies. The signs and symptoms of potential  ?                          delayed complications were discussed with the  ?                          patient.  Return to normal activities tomorrow.  ?                          Written discharge instructions were provided to the  ?                          patient. ?                          - Resume previous diet. ?                          - Continue present medications. ?                          - Await pathology results. ?                          - Repeat colonoscopy at age 26 for screening  ?  purposes. ?Mauri Pole, MD ?02/13/2022 2:50:13 PM ?This report has been signed electronically. ?

## 2022-02-14 ENCOUNTER — Other Ambulatory Visit: Payer: Self-pay

## 2022-02-15 ENCOUNTER — Telehealth: Payer: Self-pay | Admitting: *Deleted

## 2022-02-15 NOTE — Telephone Encounter (Signed)
No answer on first attempt follow up call. Mailbox full.   ? ?

## 2022-02-15 NOTE — Telephone Encounter (Signed)
?  Follow up Call- ? ? ?  02/13/2022  ?  1:44 PM  ?Call back number  ?Post procedure Call Back phone  # [phone]  ?Permission to leave phone message Yes  ?  ? ?Patient questions: ? ?Do you have a fever, pain , or abdominal swelling? No. ?Pain Score  0 * ? ?Have you tolerated food without any problems? Yes.   ? ?Have you been able to return to your normal activities? Yes.   ? ?Do you have any questions about your discharge instructions: ?Diet   No. ?Medications  No. ?Follow up visit  No. ? ?Do you have questions or concerns about your Care? No. ? ?Actions: ?* If pain score is 4 or above: ?No action needed, pain <4. ? ?  ? ?

## 2022-02-28 ENCOUNTER — Encounter: Payer: Self-pay | Admitting: Gastroenterology

## 2022-03-26 NOTE — Progress Notes (Unsigned)
03/26/2022 Monica Randall [id number] 12/14/1985   Chief Complaint:  History of Present Illness: Monica Randall is a 36 year old female with a past medical history of asthma as a child, kidney stones and GERD. S/P cholecystectomy 2012 and right oophorectomy secondary to a benign ovarian cyst.  She presents to our office today for follow up regarding GERD and diarrhea.  She speaks Spanish   EGD 02/13/2022 by Dr. Silverio Decamp: - LA Grade C reflux esophagitis with no bleeding. - Non-bleeding gastric ulcers. Biopsied. - Gastritis. Biopsied. - Normal examined duodenum.  Colonoscopy 02/13/2022: - LA Grade C reflux esophagitis with no bleeding. - Non-bleeding gastric ulcers. Biopsied. - Gastritis. Biopsied. - Normal examined duodenum. .  1. Surgical [P], gastritis/ ulcers - REACTIVE GASTROPATHY WITH FOCAL FOVEOLAR HYPERPLASIA, ANTRAL. - GASTRIC OXYNTIC MUCOSA WITH NO SPECIFIC PATHOLOGIC DIAGNOSIS. - NEGATIVE FOR INTESTINAL METAPLASIA AND MALIGNANCY. - NEGATIVE FOR AN INFLAMMATORY PATTERN PREDICTIVE OF HELICOBACTER PYLORI INFECTION. 2. Surgical [P], colon nos, random sites - SMALL FOCUS SUGGESTIVE OF EARLY HYPERPLASTIC POLYP. - NEGATIVE FOR ADENOMATOUS CHANGE AND MALIGNANCY. - NEGATIVE FOR ACTIVE COLITIS AND PATHOLOGIC INTRAEPITHELIAL LYMPHOCYTOSIS.     Latest Ref Rng & Units 12/27/2021    9:38 AM 06/08/2021    9:23 AM 01/18/2021    9:58 AM  CBC  WBC 3.4 - 10.8 x10E3/uL 7.3  7.7  7.4   Hemoglobin 11.1 - 15.9 g/dL 14.4  13.8  13.9   Hematocrit 34.0 - 46.6 % 44.2  42.2  42.0   Platelets 150 - 450 x10E3/uL 359  334  378        Latest Ref Rng & Units 12/27/2021    9:38 AM 06/08/2021    9:23 AM 01/18/2021    9:58 AM  CMP  Glucose 70 - 99 mg/dL 99  88  87   BUN 6 - 20 mg/dL '10  10  9   '$ Creatinine 0.57 - 1.00 mg/dL 0.70  0.63  0.71   Sodium 134 - 144 mmol/L 143  142  141   Potassium 3.5 - 5.2 mmol/L 4.9  4.6  4.8   Chloride 96 - 106 mmol/L 105  101  102   CO2 20 - 29 mmol/L '23  22  20    '$ Calcium 8.7 - 10.2 mg/dL 9.7  9.6  9.4   Total Protein 6.0 - 8.5 g/dL 6.8  6.7  7.3   Total Bilirubin 0.0 - 1.2 mg/dL 0.2  0.2  0.2   Alkaline Phos 44 - 121 IU/L 75  80  68   AST 0 - 40 IU/L '25  24  24   '$ ALT 0 - 32 IU/L '25  19  15       '$ Current Medications, Allergies, Past Medical History, Past Surgical History, Family History and Social History were reviewed in [employer] record.   Review of Systems:   Constitutional: Negative for fever, sweats, chills or weight loss.  Respiratory: Negative for shortness of breath.   Cardiovascular: Negative for chest pain, palpitations and leg swelling.  Gastrointestinal: See HPI.  Musculoskeletal: Negative for back pain or muscle aches.  Neurological: Negative for dizziness, headaches or paresthesias.    Physical Exam: There were no vitals taken for this visit. General: Well developed, w   ***female in no acute distress. Head: Normocephalic and atraumatic. Eyes: No scleral icterus. Conjunctiva pink . Ears: Normal auditory acuity. Mouth: Dentition intact. No ulcers or lesions.  Lungs: Clear throughout to auscultation. Heart: Regular rate and  rhythm, no murmur. Abdomen: Soft, nontender and nondistended. No masses or hepatomegaly. Normal bowel sounds x 4 quadrants.  Rectal: *** Musculoskeletal: Symmetrical with no gross deformities. Extremities: No edema. Neurological: Alert oriented x 4. No focal deficits.  Psychological: Alert and cooperative. Normal mood and affect  Assessment and Recommendations: ***

## 2022-03-27 ENCOUNTER — Ambulatory Visit: Payer: Commercial Managed Care - HMO | Admitting: Nurse Practitioner

## 2022-03-27 ENCOUNTER — Encounter: Payer: Self-pay | Admitting: Nurse Practitioner

## 2022-03-27 VITALS — BP 112/74 | HR 82 | Ht 64.0 in | Wt 234.0 lb

## 2022-03-27 DIAGNOSIS — K2100 Gastro-esophageal reflux disease with esophagitis, without bleeding: Secondary | ICD-10-CM

## 2022-03-27 DIAGNOSIS — E669 Obesity, unspecified: Secondary | ICD-10-CM | POA: Diagnosis not present

## 2022-03-27 NOTE — Patient Instructions (Addendum)
Take Omeprazole 40 mg 1 twice daily for 2 months  We will refer you to [place] and Weight Management   If you are age 37 or older, your body mass index should be between 23-30. Your Body mass index is 40.17 kg/m. If this is out of the aforementioned range listed, please consider follow up with your Primary Care Provider.  If you are age 45 or younger, your body mass index should be between 19-25. Your Body mass index is 40.17 kg/m. If this is out of the aformentioned range listed, please consider follow up with your Primary Care Provider.   ________________________________________________________  The [facility] providers would like to encourage you to use [facility] to communicate with providers for non-urgent requests or questions.  Due to long hold times on the telephone, sending your provider a message by [facility] may be a faster and more efficient way to get a response.  Please allow 48 business hours for a response.  Please remember that this is for non-urgent requests.  _______________________________________________________   I appreciate the  opportunity to care for you  Thank You   Marcella Dubs

## 2022-04-06 ENCOUNTER — Encounter: Payer: Self-pay | Admitting: Obstetrics & Gynecology

## 2022-04-06 ENCOUNTER — Ambulatory Visit: Payer: Commercial Managed Care - HMO

## 2022-04-06 ENCOUNTER — Ambulatory Visit (INDEPENDENT_AMBULATORY_CARE_PROVIDER_SITE_OTHER): Payer: Commercial Managed Care - HMO | Admitting: Obstetrics & Gynecology

## 2022-04-06 VITALS — BP 112/68

## 2022-04-06 DIAGNOSIS — R1032 Left lower quadrant pain: Secondary | ICD-10-CM | POA: Diagnosis not present

## 2022-04-06 DIAGNOSIS — E282 Polycystic ovarian syndrome: Secondary | ICD-10-CM

## 2022-04-06 DIAGNOSIS — Z3041 Encounter for surveillance of contraceptive pills: Secondary | ICD-10-CM | POA: Diagnosis not present

## 2022-04-06 NOTE — Progress Notes (Signed)
    Monica Randall Mar 06, 1986 [id number]        36 y.o.  G2P0020   RP: Intermittent Lt pelvic pain for pelvic US  HPI: Patient had intermittent Left pelvic pain, probably ovulatory.  H/O PCOS. S/P Rt Oophorectomy. Had stopped BCPs because of worries about bilateral breast tenderness in 2022.  Decision to restart on BCPs with Blisovi Fe 24 on 01/05/2022.  Resolved intermittent Lt pelvic pain since then.   OB History  Gravida Para Term Preterm AB Living  2 0     2 0  SAB IAB Ectopic Multiple Live Births  2            # Outcome Date GA Lbr Len/2nd Weight Sex Delivery Anes PTL Lv  2 SAB           1 SAB             Past medical history,surgical history, problem list, medications, allergies, family history and social history were all reviewed and documented in the EPIC chart.   Directed ROS with pertinent positives and negatives documented in the history of present illness/assessment and plan.  Exam:  Vitals:   04/06/22 1426  BP: 112/68   General appearance:  Normal  Pelvic US today: T/V images.  Anteverted uterus normal in size and shape with a small fibroid measured at 2.1 cm..  The overall uterine size is measured at 8.54 x 5.13 x 4.37 cm.  The myometrium is heterogeneous compatible with adenomyosis.  The endometrial lining is thin, symmetrical and avascular measured at 5.3 mm with no obvious mass or thickening.  The right ovary is surgically absent.  The left ovary presents multiple small follicles at the periphery compatible with PCOS.  The left ovary is stable compared to the last pelvic ultrasound in March 2023.  No adnexal mass.  No free fluid in the pelvis.   Assessment/Plan:  36 y.o. G2P0020   1. Intermittent left lower quadrant abdominal pain Patient had intermittent Left pelvic pain, probably ovulatory.  H/O PCOS. S/P Rt Oophorectomy. Had stopped BCPs because of worries about bilateral breast tenderness in 2022.  Decision to restart on BCPs with Blisovi Fe 24 on 01/05/2022.   Resolved intermittent Lt pelvic pain since then. Pelvic US stable c/w the pelvic US in 12/2021.  Patient reassured.  No CI to continue on the BCPs.  2. PCOS (polycystic ovarian syndrome) Left ovary c/w PCOS, unchanged since 12/2021.  Reassured.  3. Encounter for surveillance of contraceptive pills  No CI to continue on Blisovi Fe 24.  Counseling on PCOS and adenomyosis both controlled on birth control pills with Blisovi FE 24, documentation reviewed, for 15 minutes.  Princess Bruins MD, 2:43 PM 04/06/2022

## 2022-04-07 ENCOUNTER — Encounter: Payer: Self-pay | Admitting: Obstetrics & Gynecology

## 2022-04-11 IMAGING — MG MM DIGITAL SCREENING BILAT W/ TOMO AND CAD
8 series · 8 of 24 positions shown · non-contrast
Comparison: None.

CLINICAL DATA: Screening.

EXAM:
DIGITAL SCREENING BILATERAL MAMMOGRAM WITH TOMOSYNTHESIS AND CAD
TECHNIQUE: Bilateral screening digital craniocaudal and mediolateral oblique
mammograms were obtained. Bilateral screening digital breast
tomosynthesis was performed. The images were evaluated with
computer-aided detection.

[R MLO synth-2D]
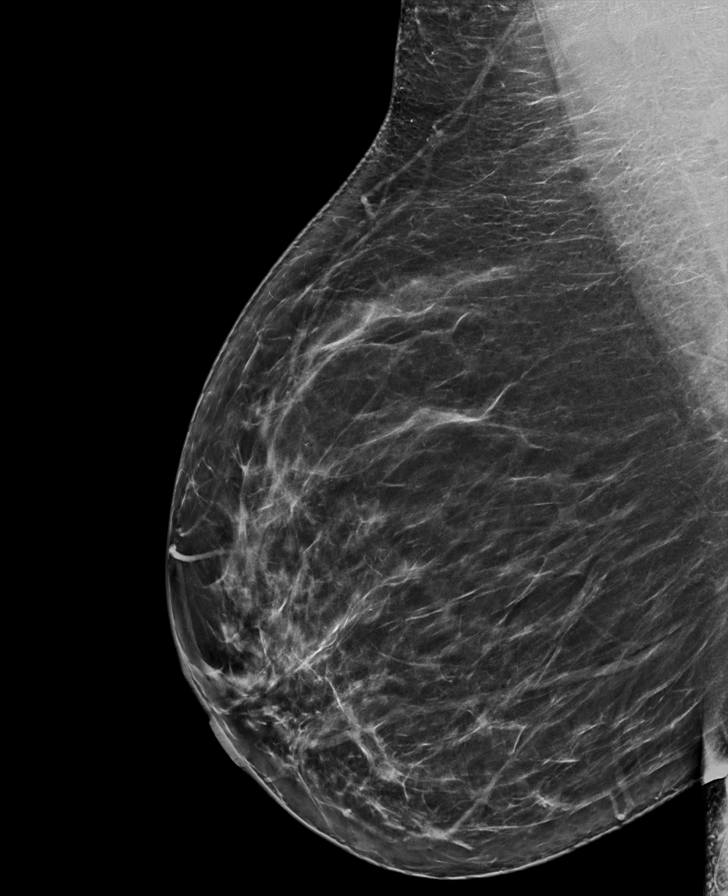

[L MLO synth-2D]
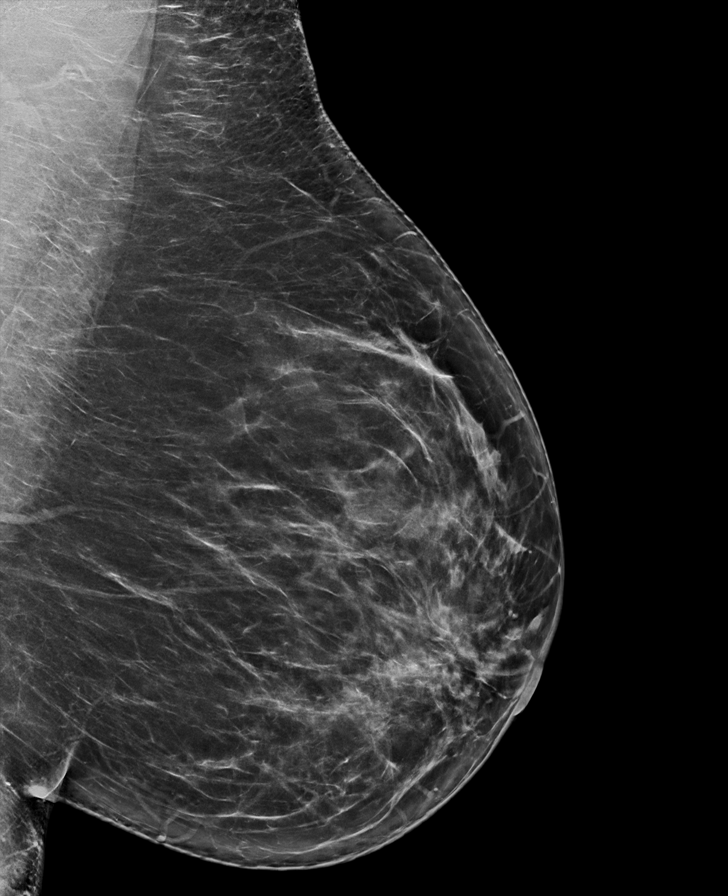

[L CC synth-2D]
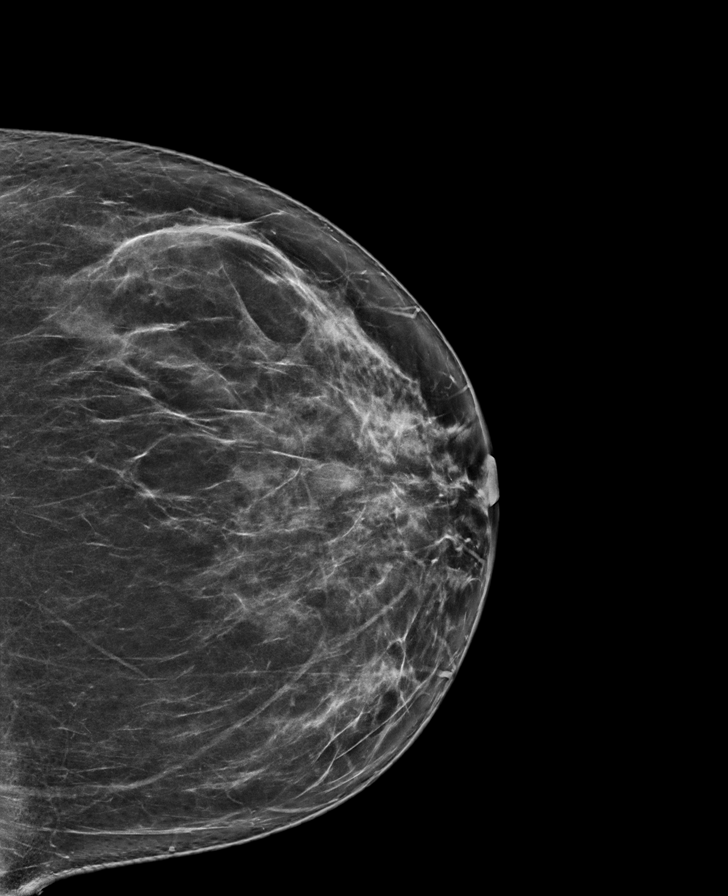

[R CC synth-2D]
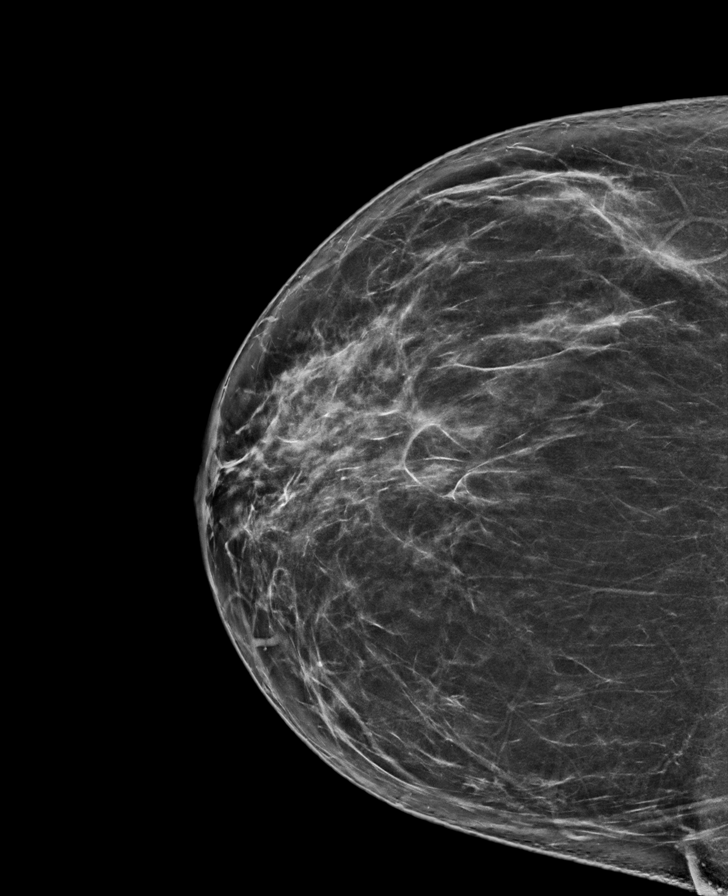

[R CC tomo · tomo slice 40/79.0]
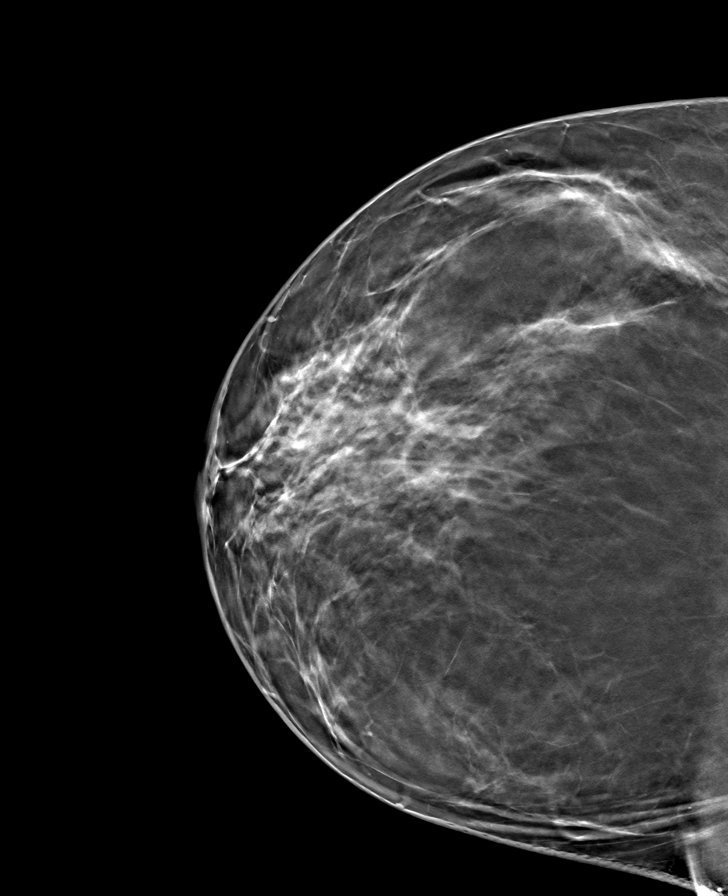

[L MLO tomo · tomo slice 45/88.0]
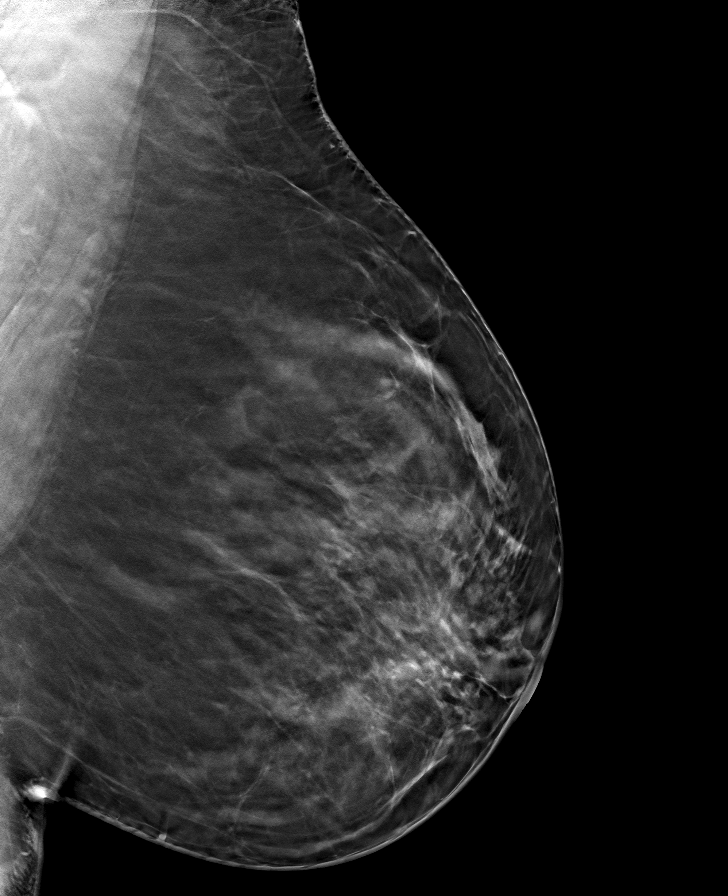

[R MLO tomo · tomo slice 45/88.0]
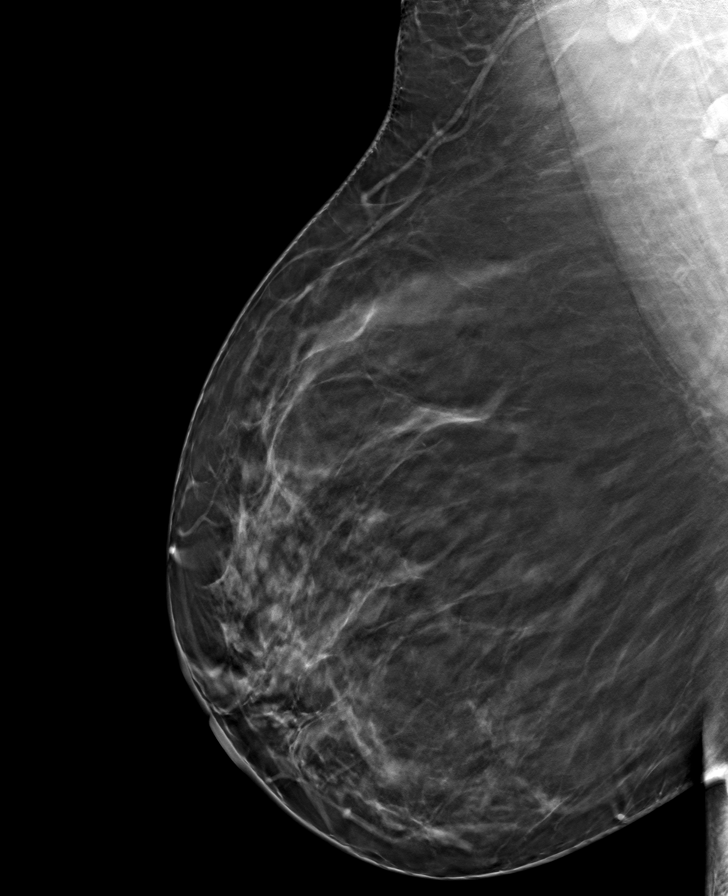

[L CC tomo · tomo slice 41/82.0]
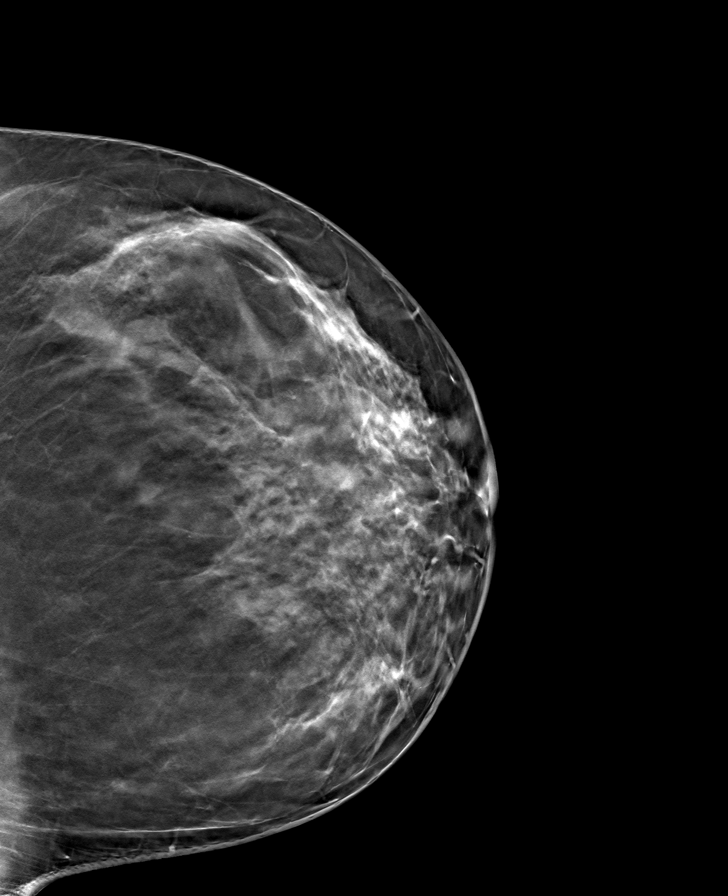

[8 of 24 positions shown; findings below may reference images not displayed]

ACR Breast Density Category c: The breast tissue is heterogeneously
dense, which may obscure small masses
FINDINGS: There are no findings suspicious for malignancy. The images were
evaluated with computer-aided detection.
IMPRESSION: No mammographic evidence of malignancy. A result letter of this
screening mammogram will be mailed directly to the patient.

RECOMMENDATION:
Screening mammogram at age 39. (Code:Q2-X-9CS)

BI-RADS CATEGORY  1: Negative.

## 2022-05-01 ENCOUNTER — Other Ambulatory Visit: Payer: Self-pay

## 2022-05-01 DIAGNOSIS — K2100 Gastro-esophageal reflux disease with esophagitis, without bleeding: Secondary | ICD-10-CM

## 2022-05-01 MED ORDER — OMEPRAZOLE 40 MG PO CPDR: 40.0000 mg | DELAYED_RELEASE_CAPSULE | Freq: Every day | ORAL | 1 refills | Status: DC

## 2022-05-15 ENCOUNTER — Encounter: Payer: Commercial Managed Care - HMO | Attending: Nurse Practitioner | Admitting: Dietician

## 2022-05-15 VITALS — Ht 65.0 in | Wt 228.7 lb

## 2022-05-15 DIAGNOSIS — E669 Obesity, unspecified: Secondary | ICD-10-CM | POA: Diagnosis not present

## 2022-05-15 DIAGNOSIS — Z713 Dietary counseling and surveillance: Secondary | ICD-10-CM | POA: Diagnosis not present

## 2022-05-15 DIAGNOSIS — E282 Polycystic ovarian syndrome: Secondary | ICD-10-CM

## 2022-05-15 DIAGNOSIS — R7303 Prediabetes: Secondary | ICD-10-CM

## 2022-05-15 NOTE — Patient Instructions (Signed)
Control portions of chicken/ meat (size of palm of hand on average), and starchy foods (1 cup or fisted hand) Plan to eat a meal or snack every 3-5 hours during the day to help control hunger and blood sugar.  Start back to regular exercise.
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
## 2022-07-05 ENCOUNTER — Encounter: Payer: Self-pay | Admitting: Legal Medicine

## 2022-07-05 ENCOUNTER — Ambulatory Visit (INDEPENDENT_AMBULATORY_CARE_PROVIDER_SITE_OTHER): Payer: Commercial Managed Care - HMO | Admitting: Legal Medicine

## 2022-07-05 VITALS — BP 118/70 | HR 81 | Temp 97.7°F | Resp 15 | Ht 65.0 in | Wt 224.0 lb

## 2022-07-05 DIAGNOSIS — E782 Mixed hyperlipidemia: Secondary | ICD-10-CM | POA: Diagnosis not present

## 2022-07-05 DIAGNOSIS — K2100 Gastro-esophageal reflux disease with esophagitis, without bleeding: Secondary | ICD-10-CM | POA: Diagnosis not present

## 2022-07-05 DIAGNOSIS — Z9189 Other specified personal risk factors, not elsewhere classified: Secondary | ICD-10-CM

## 2022-07-05 DIAGNOSIS — R7303 Prediabetes: Secondary | ICD-10-CM | POA: Diagnosis not present

## 2022-07-05 DIAGNOSIS — E282 Polycystic ovarian syndrome: Secondary | ICD-10-CM

## 2022-07-05 LAB — POC COVID19 BINAXNOW: SARS Coronavirus 2 Ag: NEGATIVE

## 2022-07-05 MED ORDER — OMEPRAZOLE 40 MG PO CPDR: 40.0000 mg | DELAYED_RELEASE_CAPSULE | Freq: Every day | ORAL | 2 refills | Status: DC

## 2022-07-05 MED ORDER — METFORMIN HCL 500 MG PO TABS: 500.0000 mg | ORAL_TABLET | Freq: Two times a day (BID) | ORAL | 3 refills | Status: DC

## 2022-07-05 NOTE — Progress Notes (Signed)
Subjective:  Patient ID: Monica Randall, female    DOB: [birth year]  Age: 36 y.o. MRN: [id number]  Chief Complaint  Patient presents with   Hyperlipidemia   Gastroesophageal Reflux   Prediabetes    HPI: chronic   GERD: Patient is taking Omeprazole 40 mg daily. Stopped bitrh control PCOS has not seen gyn.    She is prediabetic no on metformin Current Outpatient Medications on File Prior to Visit  Medication Sig Dispense Refill   Ascorbic Acid (VITAMIN C PO) Take by mouth.     FIBER PO Take by mouth. Takes tablets or powder     MAGNESIUM PO Take by mouth.     Multiple Vitamins-Minerals (MULTIVIT/MULTIMINERAL ADULT PO) Take by mouth.     VITAMIN D, CHOLECALCIFEROL, PO Take by mouth.     VITAMIN E PO Take by mouth.     No current facility-administered medications on file prior to visit.   Past Medical History:  Diagnosis Date   GERD (gastroesophageal reflux disease)    Kidney stones    Mild intermittent asthma 07/20/2020   as a child   Mixed hyperlipidemia 07/21/2020   Ovarian cyst    Prediabetes 07/21/2020   Past Surgical History:  Procedure Laterality Date   GALLBLADDER SURGERY  2012   KIDNEY STONE SURGERY  2018   Laser surgery   OOPHORECTOMY Right 2011    Family History  Problem Relation Age of Onset   Cancer Maternal Aunt        leukemia   Thyroid disease Maternal Grandfather    Thyroid disease Other    Colon cancer Neg Hx    Esophageal cancer Neg Hx    Rectal cancer Neg Hx    Stomach cancer Neg Hx    Social History   Socioeconomic History   Marital status: Single    Spouse name: Not on file   Number of children: Not on file   Years of education: Not on file   Highest education level: Not on file  Occupational History   Occupation: Diplomatic Services operational officer  Tobacco Use   Smoking status: Never   Smokeless tobacco: Never  Vaping Use   Vaping Use: Never used  Substance and Sexual Activity   Alcohol use: Yes    Comment: occ   Drug use: Never   Sexual activity:  Yes    Partners: Male    Birth control/protection: OCP    Comment: 1st intercourse- 72, partners- more than 5  Other Topics Concern   Not on file  Social History Narrative   Not on file   Social Determinants of Health   Financial Resource Strain: Not on file  Food Insecurity: Not on file  Transportation Needs: Not on file  Physical Activity: Not on file  Stress: Not on file  Social Connections: Not on file    Review of Systems  Constitutional:  Negative for chills, fatigue and fever.  HENT:  Positive for congestion and sore throat. Negative for ear pain.   Eyes:  Negative for visual disturbance.  Respiratory:  Negative for cough and shortness of breath.   Cardiovascular:  Negative for chest pain and palpitations.  Gastrointestinal:  Negative for abdominal pain, constipation, diarrhea, nausea and vomiting.  Endocrine: Negative for polydipsia, polyphagia and polyuria.  Genitourinary:  Negative for difficulty urinating and dysuria.  Musculoskeletal:  Negative for arthralgias, back pain and myalgias.  Skin:  Negative for rash.  Neurological:  Negative for headaches.  Psychiatric/Behavioral:  Negative for dysphoric mood. The patient is  not nervous/anxious.      Objective:  BP 118/70   Pulse 81   Temp 97.7 F (36.5 C)   Resp 15   Ht '5\' 5"'$  (1.651 m)   Wt 224 lb (101.6 kg)   SpO2 97%   BMI 37.28 kg/m      07/05/2022   10:06 AM 05/15/2022   10:46 AM 04/06/2022    2:26 PM  BP/Weight  Systolic BP 016  010  Diastolic BP 70  68  Wt. (Lbs) 224 228.7   BMI 37.28 kg/m2 38.06 kg/m2     Physical Exam Vitals reviewed.  Constitutional:      General: She is not in acute distress.    Appearance: Normal appearance.  HENT:     Head: Normocephalic and atraumatic.     Right Ear: Tympanic membrane normal.     Left Ear: Tympanic membrane normal.     Nose: Nose normal.     Mouth/Throat:     Mouth: Mucous membranes are moist.     Pharynx: Oropharynx is clear.  Eyes:      Extraocular Movements: Extraocular movements intact.     Conjunctiva/sclera: Conjunctivae normal.     Pupils: Pupils are equal, round, and reactive to light.  Cardiovascular:     Rate and Rhythm: Normal rate and regular rhythm.     Pulses: Normal pulses.     Heart sounds: Normal heart sounds. No murmur heard.    No gallop.  Pulmonary:     Effort: Pulmonary effort is normal. No respiratory distress.     Breath sounds: Normal breath sounds. No wheezing.  Abdominal:     General: Abdomen is flat. Bowel sounds are normal. There is no distension.     Palpations: Abdomen is soft.     Tenderness: There is no abdominal tenderness.  Musculoskeletal:        General: Normal range of motion.     Cervical back: Normal range of motion and neck supple.  Skin:    General: Skin is warm.     Capillary Refill: Capillary refill takes less than 2 seconds.  Neurological:     General: No focal deficit present.     Mental Status: She is alert and oriented to person, place, and time. Mental status is at baseline.     Gait: Gait normal.  Psychiatric:        Mood and Affect: Mood normal.        Thought Content: Thought content normal.        Judgment: Judgment normal.    Diabetic Foot Exam - Simple   No data filed      Lab Results  Component Value Date   WBC 6.7 07/05/2022   HGB 14.0 07/05/2022   HCT 42.9 07/05/2022   PLT 306 07/05/2022   GLUCOSE 88 07/05/2022   CHOL 152 07/05/2022   TRIG 92 07/05/2022   HDL 56 07/05/2022   LDLCALC 79 07/05/2022   ALT 25 07/05/2022   AST 21 07/05/2022   NA 139 07/05/2022   K 4.6 07/05/2022   CL 103 07/05/2022   CREATININE 0.61 07/05/2022   BUN 9 07/05/2022   CO2 22 07/05/2022   TSH 2.520 12/27/2021   HGBA1C 5.5 07/05/2022      Assessment & Plan:   Problem List Items Addressed This Visit       Digestive   GERD (gastroesophageal reflux disease)   Relevant Medications   omeprazole (PRILOSEC) 40 MG capsule Plan of care was formulated  today.  She  is doing well.  A plan of care was formulated using patient exam, tests and other sources to optimize care using evidence based information.  Recommend no smoking, no eating after supper, avoid fatty foods, elevate Head of bed, avoid tight fitting clothing.  Continue on OTC.      Endocrine   PCOS (polycystic ovarian syndrome)   Relevant Medications   metFORMIN (GLUCOPHAGE) 500 MG tablet Patient has long histry of PCOS, refer to GYN, start metformin     Other   Mixed hyperlipidemia - Primary   Relevant Orders   Lipid panel (Completed)   CBC with Differential/Platelet (Completed) AN INDIVIDUAL CARE PLAN for hyperlipidemia/ cholesterol was established and reinforced today.  The patient's status was assessed using clinical findings on exam, lab and other diagnostic tests. The patient's disease status was assessed based on evidence-based guidelines and found to be fair controlled. MEDICATIONS were reviewed. SELF MANAGEMENT GOALS have been discussed and patient's success at attaining the goal of low cholesterol was assessed. RECOMMENDATION given include regular exercise 3 days a week and low cholesterol/low fat diet. CLINICAL SUMMARY including written plan to identify barriers unique to the patient due to social or economic  reasons was discussed.     Prediabetes   Relevant Medications   metFORMIN (GLUCOPHAGE) 500 MG tablet   Other Relevant Orders   Comprehensive metabolic panel (Completed)   Hemoglobin A1c (Completed) Continue diet and exercise   Other Visit Diagnoses     At increased risk of exposure to COVID-19 virus       Relevant Orders   POC COVID-19 (Completed)- negative     .  Meds ordered this encounter  Medications   metFORMIN (GLUCOPHAGE) 500 MG tablet    Sig: Take 1 tablet (500 mg total) by mouth 2 (two) times daily with a meal.    Dispense:  180 tablet    Refill:  3   omeprazole (PRILOSEC) 40 MG capsule    Sig: Take 1 capsule (40 mg total) by mouth daily.     Dispense:  90 capsule    Refill:  2    Orders Placed This Encounter  Procedures   Comprehensive metabolic panel   Lipid panel   CBC with Differential/Platelet   Hemoglobin A1c   Cardiovascular Risk Assessment   POC COVID-19     Follow-up: Return in about 4 months (around 11/04/2022).  An After Visit Summary was printed and given to the patient.  Reinaldo Meeker, MD [facility] [phone]

## 2022-07-06 LAB — CBC WITH DIFFERENTIAL/PLATELET
Basophils Absolute: 0.1 10*3/uL (ref 0.0–0.2)
Basos: 1 %
EOS (ABSOLUTE): 0.1 10*3/uL (ref 0.0–0.4)
Eos: 2 %
Hematocrit: 42.9 % (ref 34.0–46.6)
Hemoglobin: 14 g/dL (ref 11.1–15.9)
Immature Grans (Abs): 0 10*3/uL (ref 0.0–0.1)
Immature Granulocytes: 1 %
Lymphocytes Absolute: 2.6 10*3/uL (ref 0.7–3.1)
Lymphs: 39 %
MCH: 28.2 pg (ref 26.6–33.0)
MCHC: 32.6 g/dL (ref 31.5–35.7)
MCV: 86 fL (ref 79–97)
Monocytes Absolute: 0.6 10*3/uL (ref 0.1–0.9)
Monocytes: 9 %
Neutrophils Absolute: 3.3 10*3/uL (ref 1.4–7.0)
Neutrophils: 48 %
Platelets: 306 10*3/uL (ref 150–450)
RBC: 4.97 x10E6/uL (ref 3.77–5.28)
RDW: 13.4 % (ref 11.7–15.4)
WBC: 6.7 10*3/uL (ref 3.4–10.8)

## 2022-07-06 LAB — LIPID PANEL
Chol/HDL Ratio: 2.7 ratio (ref 0.0–4.4)
Cholesterol, Total: 152 mg/dL (ref 100–199)
HDL: 56 mg/dL (ref >39)
LDL Chol Calc (NIH): 79 mg/dL (ref 0–99)
Triglycerides: 92 mg/dL (ref 0–149)
VLDL Cholesterol Cal: 17 mg/dL (ref 5–40)

## 2022-07-06 LAB — COMPREHENSIVE METABOLIC PANEL
ALT: 25 IU/L (ref 0–32)
AST: 21 IU/L (ref 0–40)
Albumin/Globulin Ratio: 1.6 (ref 1.2–2.2)
Albumin: 4.3 g/dL (ref 3.9–4.9)
Alkaline Phosphatase: 77 IU/L (ref 44–121)
BUN/Creatinine Ratio: 15 (ref 9–23)
BUN: 9 mg/dL (ref 6–20)
Bilirubin Total: <0.2 mg/dL (ref 0.0–1.2)
CO2: 22 mmol/L (ref 20–29)
Calcium: 9.3 mg/dL (ref 8.7–10.2)
Chloride: 103 mmol/L (ref 96–106)
Creatinine, Ser: 0.61 mg/dL (ref 0.57–1.00)
Globulin, Total: 2.7 g/dL (ref 1.5–4.5)
Glucose: 88 mg/dL (ref 70–99)
Potassium: 4.6 mmol/L (ref 3.5–5.2)
Sodium: 139 mmol/L (ref 134–144)
Total Protein: 7 g/dL (ref 6.0–8.5)
eGFR: 118 mL/min/{1.73_m2} (ref >59)

## 2022-07-06 LAB — HEMOGLOBIN A1C
Est. average glucose Bld gHb Est-mCnc: 111 mg/dL
Hgb A1c MFr Bld: 5.5 % (ref 4.8–5.6)

## 2022-07-06 LAB — CARDIOVASCULAR RISK ASSESSMENT

## 2022-07-07 ENCOUNTER — Ambulatory Visit: Payer: Commercial Managed Care - HMO | Admitting: Dietician

## 2022-07-07 NOTE — Progress Notes (Signed)
A1c 5.5 normal today, kidney and liver tests normal, Cholesterol ok, CBC normal lp

## 2022-07-24 ENCOUNTER — Encounter: Payer: Self-pay | Admitting: Obstetrics & Gynecology

## 2022-07-24 ENCOUNTER — Ambulatory Visit (INDEPENDENT_AMBULATORY_CARE_PROVIDER_SITE_OTHER): Payer: Commercial Managed Care - HMO | Admitting: Obstetrics & Gynecology

## 2022-07-24 VITALS — BP 124/80

## 2022-07-24 DIAGNOSIS — Z789 Other specified health status: Secondary | ICD-10-CM

## 2022-07-24 DIAGNOSIS — D252 Subserosal leiomyoma of uterus: Secondary | ICD-10-CM

## 2022-07-24 DIAGNOSIS — G8929 Other chronic pain: Secondary | ICD-10-CM

## 2022-07-24 DIAGNOSIS — M545 Low back pain, unspecified: Secondary | ICD-10-CM | POA: Diagnosis not present

## 2022-07-24 NOTE — Progress Notes (Signed)
    Monica Randall Apr 14, 1986 [id number]        36 y.o.  G2P0020 Married  RP: Concerned about the Uterine Fibroid seen by [place] and lower back pains  HPI: Stopped Bl[place]ovi Fe 1/20 in 06/2022 because of bilateral leg pains which resolved after stopping the pill.  LMP normal 07/10/22.  No pelvic pain.  Rarely sexually active.  No pain with IC.  Will use condoms.  C/O lower back pain.  Concerned about the Uterine Fibroid present on the Pelvic US on 04/06/22.     OB History  Gravida Para Term Preterm AB Living  2 0     2 0  SAB IAB Ectopic Multiple Live Births  2            # Outcome Date GA Lbr Len/2nd Weight Sex Delivery Anes PTL Lv  2 SAB           1 SAB             Past medical history,surgical history, problem list, medications, allergies, family history and social history were all reviewed and documented in the EPIC chart.   Directed ROS with pertinent positives and negatives documented in the history of present illness/assessment and plan.  Exam:  Vitals:   07/24/22 1348  BP: 124/80   General appearance:  Normal  Abdomen: Normal  Gynecologic exam: Vulva normal.  Bimanual exam:  AV uterus, normal volume, mobile, NT.  No adnexal mass, NT.  U/A Negative  Pelvic US 04/06/22: T/V images.  Anteverted uterus normal in size and shape with a small fibroid measured at 2.1 cm (Subserosal).  The overall uterine size is measured at 8.54 x 5.13 x 4.37 cm.  The myometrium is heterogeneous compatible with adenomyosis.  The endometrial lining is thin, symmetrical and avascular measured at 5.3 mm with no obvious mass or thickening.  The right ovary is surgically absent.  The left ovary presents multiple small follicles at the periphery compatible with PCOS.  The left ovary is stable compared to the last pelvic ultrasound in March 2023. No adnexal mass.  No free fluid in the pelvis.    Assessment/Plan:  36 y.o. G2P0020   1. Subserous leiomyoma of uterus Small SS Fibroid at the fundus measured at  2.1 cm on Pelvic US in 03/2022.  Counseling done on uterine fibroids.  Patient reassured that her fibroid is small at 2.1 cm and its location is not likely to cause problems at this time.  Recommend observation.  Repeat a Pelvic US at 1 year in 03/2023.  Patient voiced understanding and agreement.  2. Chronic low back pain, unspecified back pain laterality, unspecified whether sciatica present U/A Negative.  Recommend evaluation with an Orthopedist to r/o disk disease. - Urinalysis,Complete w/RFL Culture    3. Use of condoms for contraception  Declines other contraceptive at this time.  Princess Bruins MD, 2:09 PM 07/24/2022

## 2022-08-01 ENCOUNTER — Telehealth: Payer: Self-pay

## 2022-08-01 NOTE — Telephone Encounter (Signed)
Fax refill request received from [name] ph [phone] for Junel Fe 24.  Per office visit note 07/24/22 patient had stopped ocps due to bilateral leg pain.  Pharmacy contacted and rx cancelled.

## 2022-08-02 ENCOUNTER — Other Ambulatory Visit: Payer: Self-pay

## 2022-08-02 DIAGNOSIS — R7303 Prediabetes: Secondary | ICD-10-CM

## 2022-08-02 DIAGNOSIS — K2100 Gastro-esophageal reflux disease with esophagitis, without bleeding: Secondary | ICD-10-CM

## 2022-08-02 DIAGNOSIS — E282 Polycystic ovarian syndrome: Secondary | ICD-10-CM

## 2022-08-03 LAB — URINALYSIS, COMPLETE W/RFL CULTURE
Bacteria, UA: NONE SEEN /HPF
Bilirubin Urine: NEGATIVE
Glucose, UA: NEGATIVE
Hgb urine dipstick: NEGATIVE
Hyaline Cast: NONE SEEN /LPF
Ketones, ur: NEGATIVE
Leukocyte Esterase: NEGATIVE
Nitrites, Initial: NEGATIVE
Protein, ur: NEGATIVE
RBC / HPF: NONE SEEN /HPF (ref 0–2)
Specific Gravity, Urine: 1.005 (ref 1.001–1.035)
WBC, UA: NONE SEEN /HPF (ref 0–5)
pH: 5.5 (ref 5.0–8.0)

## 2022-08-03 LAB — NO CULTURE INDICATED

## 2022-09-07 ENCOUNTER — Encounter: Payer: Commercial Managed Care - HMO | Attending: Nurse Practitioner | Admitting: Dietician

## 2022-09-07 ENCOUNTER — Encounter: Payer: Self-pay | Admitting: Dietician

## 2022-09-07 VITALS — Wt 226.6 lb

## 2022-09-07 DIAGNOSIS — E669 Obesity, unspecified: Secondary | ICD-10-CM | POA: Insufficient documentation

## 2022-09-07 DIAGNOSIS — Z6837 Body mass index (BMI) 37.0-37.9, adult: Secondary | ICD-10-CM | POA: Diagnosis not present

## 2022-09-07 DIAGNOSIS — R7303 Prediabetes: Secondary | ICD-10-CM | POA: Insufficient documentation

## 2022-09-07 DIAGNOSIS — E282 Polycystic ovarian syndrome: Secondary | ICD-10-CM

## 2022-09-07 NOTE — Progress Notes (Signed)
Medical Nutrition Therapy: Visit start time: 9163  end time: 1520  Assessment:  Diagnosis: obesity, prediabetes Medical history changes: no changes Psychosocial issues/ stress concerns: patient reported anxiety and depression  Medications, supplement changes: no changes   Current weight: 226.6lbs Height: 5'5" BMI: 37.71  Progress and evaluation:  Patient reports portion control has been difficult. Feels anxious often and has urge to eat. Eats quickly at times, especially when feeling very hungry or anxious Has been eating more vegetables, reports better control of carbohydrates including starches and sodas Back and leg pain improving, but still intermittent Reports fluctuating emotions.     Dietary Intake:  Usual eating pattern includes 2-3 meals and 1-2 snacks per day. Dining out frequency: several meals per week.  Breakfast: protein shake; bread; sometimes skips Snack: fruit or yogurt, sometimes none Lunch: fish/ chicken + vegetables + rice/ pasta avg 1 cup, but sometimes more than one starch at a meal Snack: same as am or none Supper: 6-8pm fish/ chicken/ eggs + veg Snack: same as am or none Beverages: water, small amount coffee or green tea, occasional juice (100% natural)  Physical activity: no regular exercise  Intervention:   Nutrition Care Education:  Basic nutrition: reviewed appropriate nutrient balance; general nutrition guidelines    Weight control: reviewed progress since previous visit; strategies for portion control and slower eating, managing hunger; reviewed importance of regular exercise Other:  managing emotional eating; relaxation breathing, chamomile/ lavender tea to help with relaxation; advised consultation with PCP for advice on anxiety medications   Nutritional Diagnosis:  [place]-2.2 Altered nutrition-related laboratory As related to pre-diabetes.  As evidenced by elevated HbA1C. [place]-3.3 Overweight/obesity As related to excess calories, inadequate physical  activity, PCOS.  As evidenced by patient with current BMI of 38.06.   Education Materials given:  Visit summary with goals/ instructions   Learner/ who was taught:  Patient    Level of understanding: Verbalizes/ demonstrates competency   Demonstrated degree of understanding via:   Teach back Learning barriers: Language: Spanish; [employer] interpreter assisted with visit  Willingness to learn/ readiness for change: Eager, change in progress   Monitoring and Evaluation:  Dietary intake, exercise, BG control, and body weight      follow up: prn

## 2022-09-07 NOTE — Patient Instructions (Signed)
Check with doctor or pharmacist for help with managing anxiety. If you are very hungry before a meal, drink 4oz of juice and then take at least 10 minutes to relax, and practice breathing slow and deep.  Start exercise again, start with at least 15 minutes and increase to your usual 60 minutes.  Start with 1 cup (fist-size) portions of rice, pasta, or potatoes (or less). Eat only one starchy food with most meals and eat larger amount of vegetables. Include a medium portion of chicken or fish.

## 2022-11-23 ENCOUNTER — Ambulatory Visit: Payer: Commercial Managed Care - HMO | Admitting: Family Medicine

## 2022-11-23 ENCOUNTER — Encounter: Payer: Self-pay | Admitting: Family Medicine

## 2022-11-23 VITALS — BP 124/74 | HR 83 | Temp 97.0°F | Ht 65.0 in | Wt 226.0 lb

## 2022-11-23 DIAGNOSIS — R0681 Apnea, not elsewhere classified: Secondary | ICD-10-CM

## 2022-11-23 DIAGNOSIS — Z6837 Body mass index (BMI) 37.0-37.9, adult: Secondary | ICD-10-CM

## 2022-11-23 DIAGNOSIS — R202 Paresthesia of skin: Secondary | ICD-10-CM | POA: Diagnosis not present

## 2022-11-23 DIAGNOSIS — R5383 Other fatigue: Secondary | ICD-10-CM | POA: Insufficient documentation

## 2022-11-23 DIAGNOSIS — L650 Telogen effluvium: Secondary | ICD-10-CM | POA: Diagnosis not present

## 2022-11-23 NOTE — Assessment & Plan Note (Signed)
Education given on diet.

## 2022-11-23 NOTE — Assessment & Plan Note (Signed)
Work on weight loss.  Consider sleep study after labs return.

## 2022-11-23 NOTE — Assessment & Plan Note (Signed)
Check labs 

## 2022-11-23 NOTE — Progress Notes (Signed)
Subjective:  Patient ID: Monica Randall, female    DOB: 04/18/1986  Age: 37 y.o. MRN: [id number]  Chief Complaint  Patient presents with   Fatigue   Numbness   Leg Pain    Leg Pain    Patient is a 37 year old Hispanic female who presents Complaining of severe fatigue.  She is also having some aching in her legs as well as paresthesias of her fingers and feet.  Patient reports she sleeps approximately 8 hours a night.  She does snore.  She also has apnea episodes that she has noted herself.  Patient has prediabetes and was previously on metformin by Dr. Henrene Pastor.  She discontinued this because she is concerned about the safety of the medication.  Patient discontinued her birth control pills which did seem to help the muscle pain in her legs.  Patient is interested in losing weight.  She did go to a nutritionist in [place].  She did not feel this helped very much.  She is  interested in some weight loss medication. LMP this month. Not sexually active x 2 months.       05/15/2022   10:50 AM 08/30/2021   11:15 AM 07/20/2020    9:34 AM  Depression screen PHQ 2/9  Decreased Interest 1 1 0  Down, Depressed, Hopeless 1 1 0  PHQ - 2 Score 2 2 0  Altered sleeping 1    Tired, decreased energy 1    Change in appetite 1    Feeling bad or failure about yourself  0    Trouble concentrating 1    Moving slowly or fidgety/restless 1    Suicidal thoughts 0    PHQ-9 Score 7           07/20/2020    9:45 AM 05/15/2022   10:50 AM  Fall Risk  Falls in the past year? 0 0  Was there an injury with Fall? 0   Fall Risk Category Calculator 0   Fall Risk Category (Retired) Low   (RETIRED) Patient Fall Risk Level Low fall risk   Fall risk Follow up Falls evaluation completed       Review of Systems  Constitutional:  Positive for chills and fatigue. Negative for fever.  HENT:  Negative for congestion, ear pain and sore throat.   Respiratory:  Positive for shortness of breath (with exertion). Negative  for cough.   Cardiovascular:  Negative for chest pain.  Gastrointestinal:  Negative for abdominal pain, constipation, diarrhea, nausea and vomiting.  Musculoskeletal:  Positive for back pain and myalgias. Negative for arthralgias.  Skin:  Negative for rash.  Neurological:  Negative for headaches.  Psychiatric/Behavioral:  Negative for dysphoric mood. The patient is nervous/anxious.     Current Outpatient Medications on File Prior to Visit  Medication Sig Dispense Refill   Ascorbic Acid (VITAMIN C PO) Take by mouth.     FIBER PO Take by mouth. Takes tablets or powder     MAGNESIUM PO Take by mouth.     Multiple Vitamins-Minerals (MULTIVIT/MULTIMINERAL ADULT PO) Take by mouth.     omeprazole (PRILOSEC) 40 MG capsule Take 1 capsule (40 mg total) by mouth daily. 90 capsule 2   VITAMIN D, CHOLECALCIFEROL, PO Take by mouth.     VITAMIN E PO Take by mouth.     No current facility-administered medications on file prior to visit.   Past Medical History:  Diagnosis Date   GERD (gastroesophageal reflux disease)    Kidney stones  Mild intermittent asthma 07/20/2020   as a child   Mixed hyperlipidemia 07/21/2020   Ovarian cyst    Prediabetes 07/21/2020   Past Surgical History:  Procedure Laterality Date   GALLBLADDER SURGERY  2012   KIDNEY STONE SURGERY  2018   Laser surgery   OOPHORECTOMY Right 2011    Family History  Problem Relation Age of Onset   Cancer Maternal Aunt        leukemia   Thyroid disease Maternal Grandfather    Thyroid disease Other    Colon cancer Neg Hx    Esophageal cancer Neg Hx    Rectal cancer Neg Hx    Stomach cancer Neg Hx    Social History   Socioeconomic History   Marital status: Single    Spouse name: Not on file   Number of children: Not on file   Years of education: Not on file   Highest education level: Not on file  Occupational History   Occupation: Diplomatic Services operational officer  Tobacco Use   Smoking status: Never   Smokeless tobacco: Never  Vaping Use    Vaping Use: Never used  Substance and Sexual Activity   Alcohol use: Yes    Comment: occ   Drug use: Never   Sexual activity: Yes    Partners: Male    Birth control/protection: OCP    Comment: 1st intercourse- 4, partners- more than 5  Other Topics Concern   Not on file  Social History Narrative   Not on file   Social Determinants of Health   Financial Resource Strain: Not on file  Food Insecurity: Not on file  Transportation Needs: Not on file  Physical Activity: Not on file  Stress: Not on file  Social Connections: Not on file    Objective:  BP 124/74   Pulse 83   Temp (!) 97 F (36.1 C)   Ht 5' 5"$  (1.651 m)   Wt 226 lb (102.5 kg)   SpO2 97%   BMI 37.61 kg/m      11/23/2022    3:50 PM 09/07/2022    2:39 PM 07/24/2022    1:48 PM  BP/Weight  Systolic BP A999333  A999333  Diastolic BP 74  80  Wt. (Lbs) 226 226.6   BMI 37.61 kg/m2 37.71 kg/m2     Physical Exam Vitals reviewed.  Constitutional:      Appearance: Normal appearance. She is obese.  Neck:     Vascular: No carotid bruit.  Cardiovascular:     Rate and Rhythm: Normal rate and regular rhythm.     Heart sounds: Normal heart sounds.  Pulmonary:     Effort: Pulmonary effort is normal. No respiratory distress.     Breath sounds: Normal breath sounds.  Abdominal:     General: Abdomen is flat. Bowel sounds are normal.     Palpations: Abdomen is soft.     Tenderness: There is no abdominal tenderness.  Musculoskeletal:        General: No swelling or tenderness. Normal range of motion.     Right lower leg: No edema.     Left lower leg: No edema.  Skin:    Findings: No rash.  Neurological:     Mental Status: She is alert and oriented to person, place, and time.  Psychiatric:        Mood and Affect: Mood normal.        Behavior: Behavior normal.     Diabetic Foot Exam - Simple   No  data filed      Lab Results  Component Value Date   WBC 6.7 07/05/2022   HGB 14.0 07/05/2022   HCT 42.9  07/05/2022   PLT 306 07/05/2022   GLUCOSE 88 07/05/2022   CHOL 152 07/05/2022   TRIG 92 07/05/2022   HDL 56 07/05/2022   LDLCALC 79 07/05/2022   ALT 25 07/05/2022   AST 21 07/05/2022   NA 139 07/05/2022   K 4.6 07/05/2022   CL 103 07/05/2022   CREATININE 0.61 07/05/2022   BUN 9 07/05/2022   CO2 22 07/05/2022   TSH 2.520 12/27/2021   HGBA1C 5.5 07/05/2022      Assessment & Plan:    Paresthesia Assessment & Plan: Check labs.  Orders: -     Comprehensive metabolic panel -     CBC -     TSH -     B12 and Folate Panel -     Methylmalonic acid, serum  Other fatigue Assessment & Plan: Check labs.  Orders: -     Comprehensive metabolic panel -     CBC -     TSH -     B12 and Folate Panel -     Methylmalonic acid, serum  Class 2 severe obesity due to excess calories with serious comorbidity and body mass index (BMI) of 37.0 to 37.9 in adult [facility]) Assessment & Plan: Education given on diet.  Orders: -     VITAMIN D 25 Hydroxy (Vit-D Deficiency, Fractures)  Telogen effluvium Assessment & Plan: Check labs.  Orders: -     Ferritin  Apnea Assessment & Plan: Work on weight loss.  Consider sleep study after labs return.       No orders of the defined types were placed in this encounter.   Orders Placed This Encounter  Procedures   Comprehensive metabolic panel   CBC   TSH   B12 and Folate Panel   Methylmalonic Acid, Serum   VITAMIN D 25 Hydroxy (Vit-D Deficiency, Fractures)   Ferritin     Follow-up: No follow-ups on file.   I,Nussen Pullin,acting as a Education administrator for Rochel Brome, MD.,have documented all relevant documentation on the behalf of Rochel Brome, MD,as directed by  Rochel Brome, MD while in the presence of Rochel Brome, MD.   An After Visit Summary was printed and given to the patient.  Rochel Brome, MD [facility] [phone]

## 2022-11-25 LAB — FERRITIN: Ferritin: 75 ng/mL (ref 15–150)

## 2022-11-28 ENCOUNTER — Other Ambulatory Visit: Payer: Self-pay | Admitting: Family Medicine

## 2022-11-28 MED ORDER — VITAMIN D (ERGOCALCIFEROL) 1.25 MG (50000 UNIT) PO CAPS: 50000.0000 [IU] | ORAL_CAPSULE | ORAL | 1 refills | Status: DC

## 2022-11-29 LAB — COMPREHENSIVE METABOLIC PANEL
ALT: 16 IU/L (ref 0–32)
AST: 17 IU/L (ref 0–40)
Albumin/Globulin Ratio: 2 (ref 1.2–2.2)
Albumin: 4.5 g/dL (ref 3.9–4.9)
Alkaline Phosphatase: 73 IU/L (ref 44–121)
BUN/Creatinine Ratio: 13 (ref 9–23)
BUN: 12 mg/dL (ref 6–20)
Bilirubin Total: <0.2 mg/dL (ref 0.0–1.2)
CO2: 21 mmol/L (ref 20–29)
Calcium: 9.1 mg/dL (ref 8.7–10.2)
Chloride: 104 mmol/L (ref 96–106)
Creatinine, Ser: 0.89 mg/dL (ref 0.57–1.00)
Globulin, Total: 2.3 g/dL (ref 1.5–4.5)
Glucose: 82 mg/dL (ref 70–99)
Potassium: 4.5 mmol/L (ref 3.5–5.2)
Sodium: 143 mmol/L (ref 134–144)
Total Protein: 6.8 g/dL (ref 6.0–8.5)
eGFR: 86 mL/min/{1.73_m2} (ref >59)

## 2022-11-29 LAB — CBC
Hematocrit: 44.5 % (ref 34.0–46.6)
Hemoglobin: 14.5 g/dL (ref 11.1–15.9)
MCH: 28.4 pg (ref 26.6–33.0)
MCHC: 32.6 g/dL (ref 31.5–35.7)
MCV: 87 fL (ref 79–97)
Platelets: 344 10*3/uL (ref 150–450)
RBC: 5.1 x10E6/uL (ref 3.77–5.28)
RDW: 13 % (ref 11.7–15.4)
WBC: 9 10*3/uL (ref 3.4–10.8)

## 2022-11-29 LAB — B12 AND FOLATE PANEL
Folate: 10.2 ng/mL (ref >3.0)
Vitamin B-12: 571 pg/mL (ref 232–1245)

## 2022-11-29 LAB — TSH: TSH: 1.6 u[IU]/mL (ref 0.450–4.500)

## 2022-11-29 LAB — VITAMIN D 25 HYDROXY (VIT D DEFICIENCY, FRACTURES): Vit D, 25-Hydroxy: 26 ng/mL — ABNORMAL LOW (ref 30.0–100.0)

## 2022-11-29 LAB — METHYLMALONIC ACID, SERUM: Methylmalonic Acid: 85 nmol/L (ref 0–378)

## 2022-12-05 ENCOUNTER — Telehealth: Payer: Self-pay

## 2022-12-05 NOTE — Telephone Encounter (Signed)
Patient stopped by to tell us that she called her insurance and the mediations that her insurance says is preferred is byetta and trulicity and is ok for you to send to pharmacy which ever you choose.

## 2022-12-06 ENCOUNTER — Ambulatory Visit (INDEPENDENT_AMBULATORY_CARE_PROVIDER_SITE_OTHER): Payer: Commercial Managed Care - HMO | Admitting: Obstetrics & Gynecology

## 2022-12-06 ENCOUNTER — Encounter: Payer: Self-pay | Admitting: Obstetrics & Gynecology

## 2022-12-06 ENCOUNTER — Other Ambulatory Visit (HOSPITAL_COMMUNITY)
Admission: RE | Admit: 2022-12-06 | Discharge: 2022-12-06 | Disposition: A | Discharge status: Home / Self Care | Payer: Commercial Managed Care - HMO | Source: Ambulatory Visit | Attending: Obstetrics & Gynecology | Admitting: Obstetrics & Gynecology

## 2022-12-06 VITALS — BP 104/72 | HR 95 | Ht 64.75 in | Wt 228.0 lb

## 2022-12-06 DIAGNOSIS — Z01419 Encounter for gynecological examination (general) (routine) without abnormal findings: Secondary | ICD-10-CM | POA: Diagnosis not present

## 2022-12-06 DIAGNOSIS — E6609 Other obesity due to excess calories: Secondary | ICD-10-CM

## 2022-12-06 DIAGNOSIS — Z30011 Encounter for initial prescription of contraceptive pills: Secondary | ICD-10-CM

## 2022-12-06 MED ORDER — BLISOVI 24 FE 1-20 MG-MCG(24) PO TABS: 1.0000 | ORAL_TABLET | Freq: Every day | ORAL | 4 refills | Status: DC

## 2022-12-06 NOTE — Telephone Encounter (Signed)
Patient was notified that medicines are for diabetes. She will call back and ask if insurance will cover for weight loss medicine.

## 2022-12-06 NOTE — Progress Notes (Signed)
Monica Randall [birth year] [id number]   History:    37 y.o.   [id number] Married   RP:  Established patient presenting for annual gyn exam    HPI: Menses regular normal every month.  No BTB.  No pelvic pain.  Breasts normal.  Mammo Neg 12/2020.  Used condoms when occasionally sexually active.  Would like to restart on BCPs.  Pap Neg 11/2021.  Pap reflex today with Gono-Chlam. Urine/BMs normal.  BMI 38.23.  Needs to increase physical activities and lower calories/carbs.  Past medical history,surgical history, family history and social history were all reviewed and documented in the EPIC chart.  Gynecologic History Patient's last menstrual period was 11/27/2022 (exact date).  Obstetric History OB History  Gravida Para Term Preterm AB Living  2 0     2 0  SAB IAB Ectopic Multiple Live Births  2            # Outcome Date GA Lbr Len/2nd Weight Sex Delivery Anes PTL Lv  2 SAB           1 SAB              ROS: A ROS was performed and pertinent positives and negatives are included in the history. GENERAL: No fevers or chills. HEENT: No change in vision, no earache, sore throat or sinus congestion. NECK: No pain or stiffness. CARDIOVASCULAR: No chest pain or pressure. No palpitations. PULMONARY: No shortness of breath, cough or wheeze. GASTROINTESTINAL: No abdominal pain, nausea, vomiting or diarrhea, melena or bright red blood per rectum. GENITOURINARY: No urinary frequency, urgency, hesitancy or dysuria. MUSCULOSKELETAL: No joint or muscle pain, no back pain, no recent trauma. DERMATOLOGIC: No rash, no itching, no lesions. ENDOCRINE: No polyuria, polydipsia, no heat or cold intolerance. No recent change in weight. HEMATOLOGICAL: No anemia or easy bruising or bleeding. NEUROLOGIC: No headache, seizures, numbness, tingling or weakness. PSYCHIATRIC: No depression, no loss of interest in normal activity or change in sleep pattern.     Exam:   BP 104/72   Pulse 95   Ht 5' 4.75" (1.645 m)   Wt 228 lb  (103.4 kg)   LMP 11/27/2022 (Exact Date)   SpO2 95%   BMI 38.23 kg/m   Body mass index is 38.23 kg/m.  General appearance : Well developed well nourished female. No acute distress HEENT: Eyes: no retinal hemorrhage or exudates,  Neck supple, trachea midline, no carotid bruits, no thyroidmegaly Lungs: Clear to auscultation, no rhonchi or wheezes, or rib retractions  Heart: Regular rate and rhythm, no murmurs or gallops Breast:Examined in sitting and supine position were symmetrical in appearance, no palpable masses or tenderness,  no skin retraction, no nipple inversion, no nipple discharge, no skin discoloration, no axillary or supraclavicular lymphadenopathy Abdomen: no palpable masses or tenderness, no rebound or guarding Extremities: no edema or skin discoloration or tenderness  Pelvic: Vulva: Normal             Vagina: No gross lesions or discharge  Cervix: No gross lesions or discharge.  Pap reflex/Gono-Chlam done.  Uterus  AV, normal size, shape and consistency, non-tender and mobile  Adnexa  Without masses or tenderness  Anus: Normal   Assessment/Plan:  37 y.o. female for annual exam   1. Encounter for routine gynecological examination with Papanicolaou smear of cervix Menses regular normal every month.  No BTB.  No pelvic pain.  Breasts normal.  Mammo Neg 12/2020.  Used condoms when occasionally sexually active.  Would like to  restart on BCPs.  Pap Neg 11/2021.  Pap reflex today with Gono-Chlam. Urine/BMs normal.  BMI 38.23.  Needs to increase physical activities and lower calories/carbs. - Cytology - PAP( [place])  2. Encounter for initial prescription of contraceptive pills Used condoms when occasionally sexually active.  Would like to restart on BCPs. No CI.  Usage reviewed. Prescription sent to pharmacy.  3. Class 2 obesity due to excess calories without serious comorbidity with body mass index (BMI) of 38.0 to 38.9 in adult BMI 38.23.  Needs to increase physical  activities and lower calories/carbs.  Other orders - Norethindrone Acetate-Ethinyl Estrad-FE (BLISOVI 24 FE) 1-20 MG-MCG(24) tablet; Take 1 tablet by mouth daily.   Princess Bruins MD, 3:23 PM

## 2022-12-13 LAB — CYTOLOGY - PAP
Chlamydia: NEGATIVE
Comment: NEGATIVE
Comment: NORMAL
Diagnosis: NEGATIVE
Neisseria Gonorrhea: NEGATIVE

## 2023-01-07 DIAGNOSIS — E559 Vitamin D deficiency, unspecified: Secondary | ICD-10-CM | POA: Insufficient documentation

## 2023-01-07 NOTE — Assessment & Plan Note (Signed)
Hemoglobin A1c 5.9%, 3 month avg of blood sugars, is in prediabetic range.  In order to prevent progression to diabetes, recommend low carb diet and regular exercise  

## 2023-01-07 NOTE — Assessment & Plan Note (Signed)
The current medical regimen is effective;  continue present plan and medications.  Taking Omeprazole 40 mg daily.

## 2023-01-07 NOTE — Progress Notes (Deleted)
Subjective:  Patient ID: Monica Randall, female    DOB: 12-Dec-1985  Age: 37 y.o. MRN: [id number]  Chief Complaint  Patient presents with   Hyperlipidemia    HPI   GERD: Patient is taking Omeprazole 40 mg daily.      05/15/2022   10:50 AM 08/30/2021   11:15 AM 07/20/2020    9:34 AM  Depression screen PHQ 2/9  Decreased Interest 1 1 0  Down, Depressed, Hopeless 1 1 0  PHQ - 2 Score 2 2 0  Altered sleeping 1    Tired, decreased energy 1    Change in appetite 1    Feeling bad or failure about yourself  0    Trouble concentrating 1    Moving slowly or fidgety/restless 1    Suicidal thoughts 0    PHQ-9 Score 7           07/20/2020    9:45 AM 05/15/2022   10:50 AM  Fall Risk  Falls in the past year? 0 0  Was there an injury with Fall? 0   Fall Risk Category Calculator 0   Fall Risk Category (Retired) Low   (RETIRED) Patient Fall Risk Level Low fall risk   Fall risk Follow up Falls evaluation completed       Review of Systems  Current Outpatient Medications on File Prior to Visit  Medication Sig Dispense Refill   Ascorbic Acid (VITAMIN C PO) Take by mouth.     FIBER PO Take by mouth. Takes tablets or powder     MAGNESIUM PO Take by mouth.     Multiple Vitamins-Minerals (MULTIVIT/MULTIMINERAL ADULT PO) Take by mouth.     Norethindrone Acetate-Ethinyl Estrad-FE (BLISOVI 24 FE) 1-20 MG-MCG(24) tablet Take 1 tablet by mouth daily. 84 tablet 4   omeprazole (PRILOSEC) 40 MG capsule Take 1 capsule (40 mg total) by mouth daily. 90 capsule 2   VITAMIN D, CHOLECALCIFEROL, PO Take by mouth.     Vitamin D, Ergocalciferol, (DRISDOL) 1.25 MG (50000 UNIT) CAPS capsule Take 1 capsule (50,000 Units total) by mouth every 7 (seven) days. (Patient not taking: Reported on 12/06/2022) 12 capsule 1   VITAMIN E PO Take by mouth.     No current facility-administered medications on file prior to visit.   Past Medical History:  Diagnosis Date   GERD (gastroesophageal reflux disease)    Kidney  stones    Mild intermittent asthma 07/20/2020   as a child   Mixed hyperlipidemia 07/21/2020   Ovarian cyst    Prediabetes 07/21/2020   Past Surgical History:  Procedure Laterality Date   GALLBLADDER SURGERY  2012   KIDNEY STONE SURGERY  2018   Laser surgery   OOPHORECTOMY Right 2011    Family History  Problem Relation Age of Onset   Cancer Maternal Aunt        leukemia   Thyroid disease Maternal Grandfather    Thyroid disease Other    Colon cancer Neg Hx    Esophageal cancer Neg Hx    Rectal cancer Neg Hx    Stomach cancer Neg Hx    Social History   Socioeconomic History   Marital status: Single    Spouse name: Not on file   Number of children: Not on file   Years of education: Not on file   Highest education level: Not on file  Occupational History   Occupation: Diplomatic Services operational officer  Tobacco Use   Smoking status: Never   Smokeless tobacco: Never  Vaping  Use   Vaping Use: Never used  Substance and Sexual Activity   Alcohol use: Yes    Comment: occ   Drug use: Never   Sexual activity: Yes    Partners: Male    Birth control/protection: Condom    Comment: 1st intercourse- 89, partners- more than 5  Other Topics Concern   Not on file  Social History Narrative   Not on file   Social Determinants of Health   Financial Resource Strain: Not on file  Food Insecurity: Not on file  Transportation Needs: Not on file  Physical Activity: Not on file  Stress: Not on file  Social Connections: Not on file    Objective:  There were no vitals taken for this visit.     12/06/2022    3:05 PM 11/23/2022    3:50 PM 09/07/2022    2:39 PM  BP/Weight  Systolic BP 123456 [id number]   Diastolic BP 72 74   Wt. (Lbs) 228 226 226.6  BMI 38.23 kg/m2 37.61 kg/m2 37.71 kg/m2    Physical Exam  Diabetic Foot Exam - Simple   No data filed      Lab Results  Component Value Date   WBC 9.0 11/23/2022   HGB 14.5 11/23/2022   HCT 44.5 11/23/2022   PLT 344 11/23/2022   GLUCOSE 82  11/23/2022   CHOL 152 07/05/2022   TRIG 92 07/05/2022   HDL 56 07/05/2022   LDLCALC 79 07/05/2022   ALT 16 11/23/2022   AST 17 11/23/2022   NA 143 11/23/2022   K 4.5 11/23/2022   CL 104 11/23/2022   CREATININE 0.89 11/23/2022   BUN 12 11/23/2022   CO2 21 11/23/2022   TSH 1.600 11/23/2022   HGBA1C 5.5 07/05/2022      Assessment & Plan:    Gastroesophageal reflux disease with esophagitis without hemorrhage Assessment & Plan: The current medical regimen is effective;  continue present plan and medications.  Taking Omeprazole 40 mg daily.   Mixed hyperlipidemia Assessment & Plan: Well controlled.  No medicines.  Continue to work on eating a healthy diet and exercise.  Labs drawn today.     Prediabetes Assessment & Plan: Hemoglobin A1c 5.9%, 3 month avg of blood sugars, is in prediabetic range.  In order to prevent progression to diabetes, recommend low carb diet and regular exercise       No orders of the defined types were placed in this encounter.   No orders of the defined types were placed in this encounter.    Follow-up: No follow-ups on file.   I,Gibran Veselka I Leal-Borjas,acting as a scribe for Rochel Brome, MD.,have documented all relevant documentation on the behalf of Rochel Brome, MD,as directed by  Rochel Brome, MD while in the presence of Rochel Brome, MD.   An After Visit Summary was printed and given to the patient.  Rochel Brome, MD [facility] [phone]

## 2023-01-07 NOTE — Assessment & Plan Note (Signed)
Well controlled.  No medicines.  Continue to work on eating a healthy diet and exercise.  Labs drawn today.   

## 2023-01-07 NOTE — Assessment & Plan Note (Signed)
Check labs 

## 2023-01-08 ENCOUNTER — Encounter: Payer: Commercial Managed Care - HMO | Admitting: Family Medicine

## 2023-01-08 DIAGNOSIS — E782 Mixed hyperlipidemia: Secondary | ICD-10-CM

## 2023-01-08 DIAGNOSIS — R7303 Prediabetes: Secondary | ICD-10-CM

## 2023-01-08 DIAGNOSIS — E559 Vitamin D deficiency, unspecified: Secondary | ICD-10-CM

## 2023-01-08 DIAGNOSIS — K2100 Gastro-esophageal reflux disease with esophagitis, without bleeding: Secondary | ICD-10-CM

## 2023-01-13 NOTE — Progress Notes (Signed)
This encounter was created in error - please disregard.

## 2023-03-15 ENCOUNTER — Ambulatory Visit: Payer: Commercial Managed Care - HMO | Admitting: Family Medicine

## 2023-03-15 ENCOUNTER — Encounter: Payer: Self-pay | Admitting: Family Medicine

## 2023-03-15 VITALS — BP 114/82 | HR 86 | Temp 97.2°F | Ht 65.0 in | Wt 228.0 lb

## 2023-03-15 DIAGNOSIS — R1032 Left lower quadrant pain: Secondary | ICD-10-CM | POA: Diagnosis not present

## 2023-03-15 DIAGNOSIS — Z1231 Encounter for screening mammogram for malignant neoplasm of breast: Secondary | ICD-10-CM

## 2023-03-15 DIAGNOSIS — N921 Excessive and frequent menstruation with irregular cycle: Secondary | ICD-10-CM

## 2023-03-15 DIAGNOSIS — Z6837 Body mass index (BMI) 37.0-37.9, adult: Secondary | ICD-10-CM

## 2023-03-15 LAB — COMPREHENSIVE METABOLIC PANEL
ALT: 18 IU/L (ref 0–32)
AST: 19 IU/L (ref 0–40)
Albumin/Globulin Ratio: 1.9 (ref 1.2–2.2)
Albumin: 4.5 g/dL (ref 3.9–4.9)
Alkaline Phosphatase: 84 IU/L (ref 44–121)
BUN/Creatinine Ratio: 13 (ref 9–23)
BUN: 9 mg/dL (ref 6–20)
Bilirubin Total: 0.3 mg/dL (ref 0.0–1.2)
CO2: 24 mmol/L (ref 20–29)
Calcium: 9.7 mg/dL (ref 8.7–10.2)
Chloride: 103 mmol/L (ref 96–106)
Creatinine, Ser: 0.7 mg/dL (ref 0.57–1.00)
Globulin, Total: 2.4 g/dL (ref 1.5–4.5)
Glucose: 94 mg/dL (ref 70–99)
Potassium: 4.8 mmol/L (ref 3.5–5.2)
Sodium: 140 mmol/L (ref 134–144)
Total Protein: 6.9 g/dL (ref 6.0–8.5)
eGFR: 114 mL/min/{1.73_m2} (ref >59)

## 2023-03-15 LAB — CBC WITH DIFFERENTIAL/PLATELET
Basophils Absolute: 0 10*3/uL (ref 0.0–0.2)
Basos: 1 %
EOS (ABSOLUTE): 0 10*3/uL (ref 0.0–0.4)
Eos: 0 %
Hematocrit: 42.8 % (ref 34.0–46.6)
Hemoglobin: 14.3 g/dL (ref 11.1–15.9)
Immature Grans (Abs): 0 10*3/uL (ref 0.0–0.1)
Immature Granulocytes: 0 %
Lymphocytes Absolute: 2.4 10*3/uL (ref 0.7–3.1)
Lymphs: 34 %
MCH: 28.3 pg (ref 26.6–33.0)
MCHC: 33.4 g/dL (ref 31.5–35.7)
MCV: 85 fL (ref 79–97)
Monocytes Absolute: 0.4 10*3/uL (ref 0.1–0.9)
Monocytes: 6 %
Neutrophils Absolute: 4.2 10*3/uL (ref 1.4–7.0)
Neutrophils: 59 %
Platelets: 359 10*3/uL (ref 150–450)
RBC: 5.06 x10E6/uL (ref 3.77–5.28)
RDW: 12.6 % (ref 11.7–15.4)
WBC: 7.1 10*3/uL (ref 3.4–10.8)

## 2023-03-15 LAB — POCT URINALYSIS DIP (CLINITEK)
Bilirubin, UA: NEGATIVE
Blood, UA: NEGATIVE
Glucose, UA: NEGATIVE mg/dL
Ketones, POC UA: NEGATIVE mg/dL
Leukocytes, UA: NEGATIVE
Nitrite, UA: NEGATIVE
POC PROTEIN,UA: NEGATIVE
Spec Grav, UA: 1.015 (ref 1.010–1.025)
Urobilinogen, UA: 0.2 E.U./dL
pH, UA: 7 (ref 5.0–8.0)

## 2023-03-15 LAB — POCT URINE PREGNANCY: Preg Test, Ur: NEGATIVE

## 2023-03-15 MED ORDER — MEDROXYPROGESTERONE ACETATE 10 MG PO TABS: 10.0000 mg | ORAL_TABLET | Freq: Every day | ORAL | 0 refills | Status: DC

## 2023-03-15 NOTE — Progress Notes (Unsigned)
Acute Office Visit  Subjective:    Patient ID: Monica Randall, female    DOB: 1986/03/12, 37 y.o.   MRN: [id number]  Chief Complaint  Patient presents with   Menstrual Problem    HPI: Patient is in today for irregular menstrual cycles. States for the past 2 months she has had a period twice each month lasting 4-5 days each. Sometimes she will bleed heavy.  4/19 - 4/22, 5/17-5/22, 5/27-5/31. Having abdominal pain, bloating, some fatigue. Has PCOS. Currently on birth control. Will have sharp left sided lower abdominal pain with menstruation. Last PAP 12/06/2022 Normal. Does not take any cramp medicine.   Past Medical History:  Diagnosis Date   GERD (gastroesophageal reflux disease)    Kidney stones    Mild intermittent asthma 07/20/2020   as a child   Mixed hyperlipidemia 07/21/2020   Ovarian cyst    Prediabetes 07/21/2020    Past Surgical History:  Procedure Laterality Date   GALLBLADDER SURGERY  2012   KIDNEY STONE SURGERY  2018   Laser surgery   OOPHORECTOMY Right 2011    Family History  Problem Relation Age of Onset   Cancer Maternal Aunt        leukemia   Thyroid disease Maternal Grandfather    Thyroid disease Other    Colon cancer Neg Hx    Esophageal cancer Neg Hx    Rectal cancer Neg Hx    Stomach cancer Neg Hx     Social History   Socioeconomic History   Marital status: Single    Spouse name: Not on file   Number of children: Not on file   Years of education: Not on file   Highest education level: Not on file  Occupational History   Occupation: Scientist, research (medical)  Tobacco Use   Smoking status: Never   Smokeless tobacco: Never  Vaping Use   Vaping Use: Never used  Substance and Sexual Activity   Alcohol use: Yes    Comment: occ   Drug use: Never   Sexual activity: Yes    Partners: Male    Birth control/protection: Condom    Comment: 1st intercourse- 15, partners- more than 5  Other Topics Concern   Not on file  Social History Narrative   Not on file    Social Determinants of Health   Financial Resource Strain: Low Risk  (03/15/2023)   Overall Financial Resource Strain (CARDIA)    Difficulty of Paying Living Expenses: Not hard at all  Food Insecurity: No Food Insecurity (03/15/2023)   Hunger Vital Sign    Worried About Running Out of Food in the Last Year: Never true    Ran Out of Food in the Last Year: Never true  Transportation Needs: No Transportation Needs (03/15/2023)   PRAPARE - Administrator, Civil Service (Medical): No    Lack of Transportation (Non-Medical): No  Physical Activity: Sufficiently Active (03/15/2023)   Exercise Vital Sign    Days of Exercise per Week: 3 days    Minutes of Exercise per Session: 60 min  Stress: No Stress Concern Present (03/15/2023)   [employer] of Occupational Health - Occupational Stress Questionnaire    Feeling of Stress : Not at all  Social Connections: Moderately Isolated (03/15/2023)   Social Connection and Isolation Panel [NHANES]    Frequency of Communication with Friends and Family: More than three times a week    Frequency of Social Gatherings with Friends and Family: More than three times a week  Attends Religious Services: Never    Active Member of Clubs or Organizations: No    Attends Banker Meetings: Never    Marital Status: Living with partner  Intimate Partner Violence: Not At Risk (03/15/2023)   Humiliation, Afraid, Rape, and Kick questionnaire    Fear of Current or Ex-Partner: No    Emotionally Abused: No    Physically Abused: No    Sexually Abused: No    Outpatient Medications Prior to Visit  Medication Sig Dispense Refill   Ascorbic Acid (VITAMIN C PO) Take by mouth.     FIBER PO Take by mouth. Takes tablets or powder     MAGNESIUM PO Take by mouth.     Multiple Vitamins-Minerals (MULTIVIT/MULTIMINERAL ADULT PO) Take by mouth.     Norethindrone Acetate-Ethinyl Estrad-FE (BLISOVI 24 FE) 1-20 MG-MCG(24) tablet Take 1 tablet by mouth daily. 84  tablet 4   omeprazole (PRILOSEC) 40 MG capsule Take 1 capsule (40 mg total) by mouth daily. 90 capsule 2   VITAMIN D, CHOLECALCIFEROL, PO Take by mouth.     Vitamin D, Ergocalciferol, (DRISDOL) 1.25 MG (50000 UNIT) CAPS capsule Take 1 capsule (50,000 Units total) by mouth every 7 (seven) days. (Patient not taking: Reported on 12/06/2022) 12 capsule 1   VITAMIN E PO Take by mouth.     No facility-administered medications prior to visit.    Allergies  Allergen Reactions   Ciprofloxacin Nausea Only    Review of Systems  Constitutional:  Negative for chills, fatigue and fever.  HENT:  Negative for congestion, ear pain, rhinorrhea and sore throat.   Respiratory:  Negative for cough and shortness of breath.   Cardiovascular:  Negative for chest pain.  Gastrointestinal:  Positive for abdominal pain (tender/uncomfortable). Negative for constipation, diarrhea, nausea and vomiting.  Genitourinary:  Negative for dysuria and urgency.  Musculoskeletal:  Negative for back pain and myalgias.  Neurological:  Negative for dizziness, weakness, light-headedness and headaches.  Psychiatric/Behavioral:  Negative for dysphoric mood. The patient is not nervous/anxious.        Objective:        03/15/2023    9:48 AM 12/06/2022    3:05 PM 11/23/2022    3:50 PM  Vitals with BMI  Height 5\' 5"  5' 4.75" 5\' 5"   Weight 228 lbs 228 lbs 226 lbs  BMI 37.94 38.22 37.61  Systolic 114 104 161  Diastolic 82 72 74  Pulse 86 95 83    No data found.   Physical Exam Vitals reviewed.  Constitutional:      Appearance: Normal appearance. She is obese.  Cardiovascular:     Rate and Rhythm: Normal rate and regular rhythm.     Heart sounds: Normal heart sounds.  Pulmonary:     Effort: Pulmonary effort is normal. No respiratory distress.     Breath sounds: Normal breath sounds.  Abdominal:     General: Abdomen is flat. Bowel sounds are normal.     Palpations: Abdomen is soft.     Tenderness: There is abdominal  tenderness (suprapubic/LLQ.).  Neurological:     Mental Status: She is alert and oriented to person, place, and time.  Psychiatric:        Mood and Affect: Mood normal.        Behavior: Behavior normal.     Health Maintenance Due  Topic Date Due   COVID-19 Vaccine (1) Never done   DTaP/Tdap/Td (1 - Tdap) Never done   MAMMOGRAM  12/31/2022  There are no preventive care reminders to display for this patient.   Lab Results  Component Value Date   TSH 1.600 11/23/2022   Lab Results  Component Value Date   WBC 7.1 03/15/2023   HGB 14.3 03/15/2023   HCT 42.8 03/15/2023   MCV 85 03/15/2023   PLT 359 03/15/2023   Lab Results  Component Value Date   NA 140 03/15/2023   K 4.8 03/15/2023   CO2 24 03/15/2023   GLUCOSE 94 03/15/2023   BUN 9 03/15/2023   CREATININE 0.70 03/15/2023   BILITOT 0.3 03/15/2023   ALKPHOS 84 03/15/2023   AST 19 03/15/2023   ALT 18 03/15/2023   PROT 6.9 03/15/2023   ALBUMIN 4.5 03/15/2023   CALCIUM 9.7 03/15/2023   EGFR 114 03/15/2023   Lab Results  Component Value Date   CHOL 152 07/05/2022   Lab Results  Component Value Date   HDL 56 07/05/2022   Lab Results  Component Value Date   LDLCALC 79 07/05/2022   Lab Results  Component Value Date   TRIG 92 07/05/2022   Lab Results  Component Value Date   CHOLHDL 2.7 07/05/2022   Lab Results  Component Value Date   HGBA1C 5.5 07/05/2022       Assessment & Plan:  Menometrorrhagia Assessment & Plan: Check CBC Start on provera 10 mg once daily x 10 days.  Orders: -     medroxyPROGESTERone Acetate; Take 1 tablet (10 mg total) by mouth daily.  Dispense: 10 tablet; Refill: 0 -     CBC with Differential/Platelet  LLQ abdominal pain Assessment & Plan: Check labs Check UA and Urine pregnancy  Orders: -     POCT URINALYSIS DIP (CLINITEK) -     POCT urine pregnancy -     CBC with Differential/Platelet -     Comprehensive metabolic panel  Encounter for screening mammogram for  malignant neoplasm of breast -     3D Screening Mammogram, Left and Right; Future  Class 2 severe obesity due to excess calories with serious comorbidity and body mass index (BMI) of 37.0 to 37.9 in adult [facility]) Assessment & Plan: Comorbidities: hyperlipidemia and prediabetes. Recommend continue to work on eating healthy diet and exercise. Start on contrave.    Orders: -     Contrave; Take 2 tablets by mouth 2 (two) times daily.  Dispense: 120 tablet; Refill: 2     Meds ordered this encounter  Medications   medroxyPROGESTERone (PROVERA) 10 MG tablet    Sig: Take 1 tablet (10 mg total) by mouth daily.    Dispense:  10 tablet    Refill:  0   Naltrexone-buPROPion HCl ER (CONTRAVE) 8-90 MG TB12    Sig: Take 2 tablets by mouth 2 (two) times daily.    Dispense:  120 tablet    Refill:  2    Orders Placed This Encounter  Procedures   MM 3D SCREENING MAMMOGRAM BILATERAL BREAST   CBC with Differential/Platelet   Comprehensive metabolic panel   POCT URINALYSIS DIP (CLINITEK)   POCT urine pregnancy     Follow-up: No follow-ups on file.  An After Visit Summary was printed and given to the patient.  Clayborn Bigness I Leal-Borjas,acting as a scribe for Blane Ohara, MD.,have documented all relevant documentation on the behalf of Blane Ohara, MD,as directed by  Blane Ohara, MD while in the presence of Blane Ohara, MD.    Blane Ohara, MD [facility] [phone]

## 2023-03-15 NOTE — Patient Instructions (Addendum)
Start on contrave.  Start on provera 10 mg once daily x 10 days.

## 2023-03-17 DIAGNOSIS — R1032 Left lower quadrant pain: Secondary | ICD-10-CM | POA: Insufficient documentation

## 2023-03-17 DIAGNOSIS — N921 Excessive and frequent menstruation with irregular cycle: Secondary | ICD-10-CM | POA: Insufficient documentation

## 2023-03-17 NOTE — Assessment & Plan Note (Signed)
Recommend continue to work on eating healthy diet and exercise. Start on contrave.

## 2023-03-17 NOTE — Assessment & Plan Note (Addendum)
Check labs Check UA and Urine pregnancy

## 2023-03-17 NOTE — Assessment & Plan Note (Signed)
Check CBC Start on provera 10 mg once daily x 10 days.

## 2023-03-21 MED ORDER — CONTRAVE 8-90 MG PO TB12
2.0000 | ORAL_TABLET | Freq: Two times a day (BID) | ORAL | 2 refills | Status: DC
Start: 2023-03-21 — End: 2023-04-18

## 2023-03-22 ENCOUNTER — Other Ambulatory Visit: Payer: Self-pay | Admitting: Family Medicine

## 2023-03-22 DIAGNOSIS — N921 Excessive and frequent menstruation with irregular cycle: Secondary | ICD-10-CM

## 2023-04-16 ENCOUNTER — Other Ambulatory Visit: Payer: Self-pay | Admitting: Family Medicine

## 2023-04-16 DIAGNOSIS — Z1231 Encounter for screening mammogram for malignant neoplasm of breast: Secondary | ICD-10-CM

## 2023-04-18 ENCOUNTER — Ambulatory Visit: Payer: Commercial Managed Care - HMO | Admitting: Family Medicine

## 2023-04-18 ENCOUNTER — Encounter: Payer: Self-pay | Admitting: Family Medicine

## 2023-04-18 ENCOUNTER — Ambulatory Visit
Admission: RE | Admit: 2023-04-18 | Discharge: 2023-04-18 | Disposition: A | Discharge status: Home / Self Care | Payer: Commercial Managed Care - HMO | Source: Ambulatory Visit | Attending: Family Medicine | Admitting: Family Medicine

## 2023-04-18 VITALS — BP 102/72 | HR 74 | Temp 97.7°F | Ht 65.0 in | Wt 226.0 lb

## 2023-04-18 DIAGNOSIS — Z1231 Encounter for screening mammogram for malignant neoplasm of breast: Secondary | ICD-10-CM

## 2023-04-18 DIAGNOSIS — R1032 Left lower quadrant pain: Secondary | ICD-10-CM

## 2023-04-18 DIAGNOSIS — N921 Excessive and frequent menstruation with irregular cycle: Secondary | ICD-10-CM

## 2023-04-18 DIAGNOSIS — N644 Mastodynia: Secondary | ICD-10-CM

## 2023-04-18 DIAGNOSIS — Z6837 Body mass index (BMI) 37.0-37.9, adult: Secondary | ICD-10-CM

## 2023-04-18 MED ORDER — CONTRAVE 8-90 MG PO TB12
2.0000 | ORAL_TABLET | Freq: Two times a day (BID) | ORAL | 2 refills | Status: DC
Start: 2023-04-18 — End: 2023-11-13

## 2023-04-18 MED ORDER — VITAMIN D (ERGOCALCIFEROL) 1.25 MG (50000 UNIT) PO CAPS: 50000.0000 [IU] | ORAL_CAPSULE | ORAL | 1 refills | Status: DC

## 2023-04-18 NOTE — Progress Notes (Signed)
Subjective:  Patient ID: Monica Randall, female    DOB: [birth year]  Age: 37 y.o. MRN: [id number]  Chief Complaint  Patient presents with   Menometrorrhagia    6 week follow up    HPI Patient is in today for follow up on irregular menstrual cycles. States for the past 3 months she has had a period twice each month lasting 4-5 days each. Sometimes she will bleed heavy.  4/19 - 4/22, 5/17-5/22, 5/27-5/31. Having abdominal pain, bloating, some fatigue. Has PCOS. Currently on birth control. Will have sharp left sided lower abdominal pain with menstruation. Last PAP 12/06/2022 Normal. Does not take any cramp medicine.   Patient was tried on Medroxyprogesterone, which the patient states that the pharmacy had never received. Patient states she has stopped bleeding regardless of not having the medication and taking it. She denies any pain/palpitations/cramping/chest pain/sob.   Patient is concerned because she went for her Mammogram appointment today and they would not do the mammogram because she was Age 66.       03/15/2023    9:53 AM 05/15/2022   10:50 AM 08/30/2021   11:15 AM 07/20/2020    9:34 AM  Depression screen PHQ 2/9  Decreased Interest 0 1 1 0  Down, Depressed, Hopeless 0 1 1 0  PHQ - 2 Score 0 2 2 0  Altered sleeping 0 1    Tired, decreased energy 0 1    Change in appetite 0 1    Feeling bad or failure about yourself  0 0    Trouble concentrating 0 1    Moving slowly or fidgety/restless 0 1    Suicidal thoughts 0 0    PHQ-9 Score 0 7    Difficult doing work/chores Not difficult at all           03/15/2023    9:52 AM  Fall Risk   Falls in the past year? 0  Number falls in past yr: 0  Injury with Fall? 0  Risk for fall due to : No Fall Risks  Follow up Falls evaluation completed    Patient Care Team: Blane Ohara, MD as PCP - General (Family Medicine) Blane Ohara, MD as Referring Physician (Family Medicine)   Review of Systems  Constitutional:  Positive for fatigue  (Gets exhausted quickly). Negative for appetite change and fever.  HENT:  Negative for congestion, ear pain, sinus pressure and sore throat.   Respiratory:  Negative for cough, chest tightness, shortness of breath and wheezing.   Cardiovascular:  Negative for chest pain and palpitations.  Gastrointestinal:  Negative for abdominal pain, constipation, diarrhea, nausea and vomiting.  Genitourinary:  Negative for dysuria and hematuria.  Musculoskeletal:  Positive for myalgias (Breast pain bilaterally - worse with mestrual cycle.). Negative for arthralgias, back pain and joint swelling.  Skin:  Negative for rash.  Neurological:  Negative for dizziness, weakness and headaches.  Psychiatric/Behavioral:  Negative for dysphoric mood. The patient is not nervous/anxious.     Current Outpatient Medications on File Prior to Visit  Medication Sig Dispense Refill   FIBER PO Take by mouth. Takes tablets or powder     MAGNESIUM PO Take by mouth.     Multiple Vitamins-Minerals (MULTIVIT/MULTIMINERAL ADULT PO) Take by mouth.     Norethindrone Acetate-Ethinyl Estrad-FE (BLISOVI 24 FE) 1-20 MG-MCG(24) tablet Take 1 tablet by mouth daily. 84 tablet 4   VITAMIN E PO Take by mouth.     Ascorbic Acid (VITAMIN C PO) Take by mouth. (  Patient not taking: Reported on 04/18/2023)     omeprazole (PRILOSEC) 40 MG capsule Take 1 capsule (40 mg total) by mouth daily. (Patient not taking: Reported on 04/18/2023) 90 capsule 2   VITAMIN D, CHOLECALCIFEROL, PO Take by mouth. (Patient not taking: Reported on 04/18/2023)     No current facility-administered medications on file prior to visit.   Past Medical History:  Diagnosis Date   GERD (gastroesophageal reflux disease)    Kidney stones    Mild intermittent asthma 07/20/2020   as a child   Mixed hyperlipidemia 07/21/2020   Ovarian cyst    Prediabetes 07/21/2020   Past Surgical History:  Procedure Laterality Date   GALLBLADDER SURGERY  2012   KIDNEY STONE SURGERY  2018    Laser surgery   OOPHORECTOMY Right 2011    Family History  Problem Relation Age of Onset   Cancer Maternal Aunt        leukemia   Thyroid disease Maternal Grandfather    Thyroid disease Other    Colon cancer Neg Hx    Esophageal cancer Neg Hx    Rectal cancer Neg Hx    Stomach cancer Neg Hx    Social History   Socioeconomic History   Marital status: Single    Spouse name: Not on file   Number of children: Not on file   Years of education: Not on file   Highest education level: Not on file  Occupational History   Occupation: Scientist, research (medical)  Tobacco Use   Smoking status: Never   Smokeless tobacco: Never  Vaping Use   Vaping status: Never Used  Substance and Sexual Activity   Alcohol use: Yes    Comment: occ   Drug use: Never   Sexual activity: Yes    Partners: Male    Birth control/protection: Condom    Comment: 1st intercourse- 15, partners- more than 5  Other Topics Concern   Not on file  Social History Narrative   Not on file   Social Determinants of Health   Financial Resource Strain: Low Risk  (03/15/2023)   Overall Financial Resource Strain (CARDIA)    Difficulty of Paying Living Expenses: Not hard at all  Food Insecurity: No Food Insecurity (03/15/2023)   Hunger Vital Sign    Worried About Running Out of Food in the Last Year: Never true    Ran Out of Food in the Last Year: Never true  Transportation Needs: No Transportation Needs (03/15/2023)   PRAPARE - Administrator, Civil Service (Medical): No    Lack of Transportation (Non-Medical): No  Physical Activity: Sufficiently Active (03/15/2023)   Exercise Vital Sign    Days of Exercise per Week: 3 days    Minutes of Exercise per Session: 60 min  Stress: No Stress Concern Present (03/15/2023)   [employer] of Occupational Health - Occupational Stress Questionnaire    Feeling of Stress : Not at all  Social Connections: Moderately Isolated (03/15/2023)   Social Connection and Isolation Panel  [NHANES]    Frequency of Communication with Friends and Family: More than three times a week    Frequency of Social Gatherings with Friends and Family: More than three times a week    Attends Religious Services: Never    Database administrator or Organizations: No    Attends Banker Meetings: Never    Marital Status: Living with partner    Objective:  BP 102/72 (BP Location: Left Arm, Patient Position: Sitting)  Pulse 74   Temp 97.7 F (36.5 C) (Temporal)   Ht 5\' 5"  (1.651 m)   Wt 226 lb (102.5 kg)   SpO2 95%   BMI 37.61 kg/m      04/18/2023    2:31 PM 03/15/2023    9:48 AM 12/06/2022    3:05 PM  BP/Weight  Systolic BP 102 114 104  Diastolic BP 72 82 72  Wt. (Lbs) 226 228 228  BMI 37.61 kg/m2 37.94 kg/m2 38.23 kg/m2    Physical Exam Vitals reviewed.  Constitutional:      Appearance: Normal appearance. She is normal weight.  Cardiovascular:     Rate and Rhythm: Normal rate and regular rhythm.     Pulses: Normal pulses.     Heart sounds: Normal heart sounds.  Pulmonary:     Effort: Pulmonary effort is normal.     Breath sounds: Normal breath sounds.  Abdominal:     General: Abdomen is flat. Bowel sounds are normal.     Palpations: Abdomen is soft.  Neurological:     Mental Status: She is alert and oriented to person, place, and time.  Psychiatric:        Mood and Affect: Mood normal.        Behavior: Behavior normal.     Diabetic Foot Exam - Simple   No data filed      Lab Results  Component Value Date   WBC 7.1 03/15/2023   HGB 14.3 03/15/2023   HCT 42.8 03/15/2023   PLT 359 03/15/2023   GLUCOSE 94 03/15/2023   CHOL 152 07/05/2022   TRIG 92 07/05/2022   HDL 56 07/05/2022   LDLCALC 79 07/05/2022   ALT 18 03/15/2023   AST 19 03/15/2023   NA 140 03/15/2023   K 4.8 03/15/2023   CL 103 03/15/2023   CREATININE 0.70 03/15/2023   BUN 9 03/15/2023   CO2 24 03/15/2023   TSH 1.600 11/23/2022   HGBA1C 5.5 07/05/2022      Assessment &  Plan:    Menometrorrhagia Assessment & Plan: Order pelvic transvaginal ultrasound  Orders: -     US PELVIC COMPLETE WITH TRANSVAGINAL; Future  LLQ abdominal pain -     US PELVIC COMPLETE WITH TRANSVAGINAL; Future  Class 2 severe obesity due to excess calories with serious comorbidity and body mass index (BMI) of 37.0 to 37.9 in adult [facility]) Assessment & Plan: Recommend continue to work on eating healthy diet and exercise.  The current medical regimen is effective;  continue present plan and medications.    Orders: -     Contrave; Take 2 tablets by mouth 2 (two) times daily.  Dispense: 120 tablet; Refill: 2  Pain of both breasts Assessment & Plan: Order diagnostic mammogram  Orders: -     MM 3D DIAGNOSTIC MAMMOGRAM BILATERAL BREAST; Future  Other orders -     Vitamin D (Ergocalciferol); Take 1 capsule (50,000 Units total) by mouth every 7 (seven) days.  Dispense: 12 capsule; Refill: 1     Meds ordered this encounter  Medications   Naltrexone-buPROPion HCl ER (CONTRAVE) 8-90 MG TB12    Sig: Take 2 tablets by mouth 2 (two) times daily.    Dispense:  120 tablet    Refill:  2   Vitamin D, Ergocalciferol, (DRISDOL) 1.25 MG (50000 UNIT) CAPS capsule    Sig: Take 1 capsule (50,000 Units total) by mouth every 7 (seven) days.    Dispense:  12 capsule    Refill:  1    Orders Placed This Encounter  Procedures   US PELVIC COMPLETE WITH TRANSVAGINAL   MM 3D DIAGNOSTIC MAMMOGRAM BILATERAL BREAST     Follow-up: Return in about 3 months (around 07/19/2023) for chronic.  I,Marla I Leal-Borjas,acting as a scribe for Blane Ohara, MD.,have documented all relevant documentation on the behalf of Blane Ohara, MD,as directed by  Blane Ohara, MD while in the presence of Blane Ohara, MD.    An After Visit Summary was printed and given to the patient.  I attest that I have reviewed this visit and agree with the plan scribed by my staff.   Blane Ohara, MD [facility] [phone]    Blane Ohara, MD [facility] [phone]

## 2023-04-19 NOTE — Assessment & Plan Note (Signed)
Recommend continue to work on eating healthy diet and exercise. The current medical regimen is effective;  continue present plan and medications.  

## 2023-04-19 NOTE — Assessment & Plan Note (Signed)
Order diagnostic mammogram

## 2023-04-19 NOTE — Assessment & Plan Note (Signed)
Order pelvic transvaginal ultrasound

## 2023-05-02 LAB — HM MAMMOGRAPHY

## 2023-05-03 ENCOUNTER — Encounter: Payer: Self-pay | Admitting: Family Medicine

## 2023-06-08 ENCOUNTER — Other Ambulatory Visit: Payer: Self-pay

## 2023-06-16 ENCOUNTER — Other Ambulatory Visit: Payer: Self-pay | Admitting: Family Medicine

## 2023-06-25 ENCOUNTER — Other Ambulatory Visit: Payer: Self-pay

## 2023-06-25 MED ORDER — VITAMIN D (ERGOCALCIFEROL) 1.25 MG (50000 UNIT) PO CAPS: 50000.0000 [IU] | ORAL_CAPSULE | ORAL | 1 refills | Status: DC

## 2023-09-20 NOTE — Progress Notes (Unsigned)
Acute Office Visit  Subjective:    Patient ID: Monica Randall, female    DOB: [birth year], 37 y.o.   MRN: [id number]  Chief Complaint  Patient presents with   Back Pain    Discussed the use of AI scribe software for clinical note transcription with the patient, who gave verbal consent to proceed.   HPI: Patient is in today for low back pain since few months ago when she stays sitting for long time or when she laying down on her side. Pain is aching and throbbing, intermittent. Rest helps with the pain   History of Present Illness The patient, with a history of kidney stones, presents with intermittent back pain that has been ongoing for the past year. The pain is described as a '5' on a scale of 1-10, and is noted to have been worse on two previous occasions. The patient reports that the pain comes and goes, and has been gradually worsening over the past year. She has not taken any medication for the pain, instead opting to lie down, drink chamomile tea, and consume large amounts of water. The patient denies any associated injuries or correlation with her menstrual cycle. She reports loose bowel movements when the back pain is present, but not during her menstrual cycle. The patient also reports leg swelling and a history of kidney stones, for which she underwent a procedure approximately five years ago. The patient suspects that the current back pain may be due to kidney stones, as the pain is similar to what she experienced previously.  Past Medical History:  Diagnosis Date   GERD (gastroesophageal reflux disease)    Kidney stones    Mild intermittent asthma 07/20/2020   as a child   Mixed hyperlipidemia 07/21/2020   Ovarian cyst    Prediabetes 07/21/2020    Past Surgical History:  Procedure Laterality Date   GALLBLADDER SURGERY  2012   KIDNEY STONE SURGERY  2018   Laser surgery   OOPHORECTOMY Right 2011    Family History  Problem Relation Age of Onset   Cancer Maternal Aunt         leukemia   Thyroid disease Maternal Grandfather    Thyroid disease Other    Colon cancer Neg Hx    Esophageal cancer Neg Hx    Rectal cancer Neg Hx    Stomach cancer Neg Hx     Social History   Socioeconomic History   Marital status: Single    Spouse name: Not on file   Number of children: Not on file   Years of education: Not on file   Highest education level: Not on file  Occupational History   Occupation: Scientist, research (medical)  Tobacco Use   Smoking status: Never   Smokeless tobacco: Never  Vaping Use   Vaping status: Never Used  Substance and Sexual Activity   Alcohol use: Yes    Comment: occ   Drug use: Never   Sexual activity: Yes    Partners: Male    Birth control/protection: Condom    Comment: 1st intercourse- 15, partners- more than 5  Other Topics Concern   Not on file  Social History Narrative   Not on file   Social Drivers of Health   Financial Resource Strain: Low Risk  (03/15/2023)   Overall Financial Resource Strain (CARDIA)    Difficulty of Paying Living Expenses: Not hard at all  Food Insecurity: No Food Insecurity (03/15/2023)   Hunger Vital Sign    Worried About Running  Out of Food in the Last Year: Never true    Ran Out of Food in the Last Year: Never true  Transportation Needs: No Transportation Needs (03/15/2023)   PRAPARE - Administrator, Civil Service (Medical): No    Lack of Transportation (Non-Medical): No  Physical Activity: Sufficiently Active (03/15/2023)   Exercise Vital Sign    Days of Exercise per Week: 3 days    Minutes of Exercise per Session: 60 min  Stress: No Stress Concern Present (03/15/2023)   [employer] of Occupational Health - Occupational Stress Questionnaire    Feeling of Stress : Not at all  Social Connections: Moderately Isolated (03/15/2023)   Social Connection and Isolation Panel [NHANES]    Frequency of Communication with Friends and Family: More than three times a week    Frequency of Social Gatherings  with Friends and Family: More than three times a week    Attends Religious Services: Never    Database administrator or Organizations: No    Attends Banker Meetings: Never    Marital Status: Living with partner  Intimate Partner Violence: Not At Risk (03/15/2023)   Humiliation, Afraid, Rape, and Kick questionnaire    Fear of Current or Ex-Partner: No    Emotionally Abused: No    Physically Abused: No    Sexually Abused: No    Outpatient Medications Prior to Visit  Medication Sig Dispense Refill   FIBER PO Take by mouth. Takes tablets or powder     MAGNESIUM PO Take by mouth.     Multiple Vitamins-Minerals (MULTIVIT/MULTIMINERAL ADULT PO) Take by mouth.     Naltrexone-buPROPion HCl ER (CONTRAVE) 8-90 MG TB12 Take 2 tablets by mouth 2 (two) times daily. 120 tablet 2   Norethindrone Acetate-Ethinyl Estrad-FE (BLISOVI 24 FE) 1-20 MG-MCG(24) tablet Take 1 tablet by mouth daily. 84 tablet 4   Vitamin D, Ergocalciferol, (DRISDOL) 1.25 MG (50000 UNIT) CAPS capsule Take 1 capsule (50,000 Units total) by mouth every 7 (seven) days. 12 capsule 1   VITAMIN E PO Take by mouth.     Ascorbic Acid (VITAMIN C PO) Take by mouth.     omeprazole (PRILOSEC) 40 MG capsule Take 1 capsule (40 mg total) by mouth daily. 90 capsule 2   VITAMIN D, CHOLECALCIFEROL, PO Take by mouth.     No facility-administered medications prior to visit.    Allergies  Allergen Reactions   Ciprofloxacin Nausea Only    Review of Systems  Constitutional:  Negative for chills, fatigue and fever.  HENT:  Negative for congestion, ear pain and sore throat.   Respiratory:  Negative for cough and shortness of breath.   Cardiovascular:  Negative for chest pain and palpitations.  Gastrointestinal:  Negative for abdominal pain, constipation, diarrhea, nausea and vomiting.  Endocrine: Negative for polydipsia, polyphagia and polyuria.  Genitourinary:  Negative for difficulty urinating and dysuria.  Musculoskeletal:   Positive for back pain. Negative for arthralgias and myalgias.  Skin:  Negative for rash.  Neurological:  Negative for headaches.  Psychiatric/Behavioral:  Negative for dysphoric mood. The patient is not nervous/anxious.        Objective:        09/21/2023    8:16 AM 04/18/2023    2:31 PM 03/15/2023    9:48 AM  Vitals with BMI  Height 5\' 5"  5\' 5"  5\' 5"   Weight 228 lbs 226 lbs 228 lbs  BMI 37.94 37.61 37.94  Systolic 98 102 114  Diastolic 70  72 82  Pulse 79 74 86    Orthostatic VS for the past 72 hrs (Last 3 readings):  Cuff Size  09/21/23 0816 Normal     Physical Exam Vitals reviewed.  Constitutional:      Appearance: Normal appearance.  Cardiovascular:     Rate and Rhythm: Normal rate and regular rhythm.     Heart sounds: Normal heart sounds.  Pulmonary:     Effort: Pulmonary effort is normal.     Breath sounds: Normal breath sounds.  Abdominal:     General: Bowel sounds are normal.     Palpations: Abdomen is soft.     Tenderness: There is no abdominal tenderness.  Neurological:     Mental Status: She is alert and oriented to person, place, and time.  Psychiatric:        Mood and Affect: Mood normal.        Behavior: Behavior normal.    Health Maintenance Due  Topic Date Due   DTaP/Tdap/Td (1 - Tdap) Never done   INFLUENZA VACCINE  05/10/2023   COVID-19 Vaccine (1 - 2024-25 season) Never done    There are no preventive care reminders to display for this patient.   Lab Results  Component Value Date   TSH 1.600 11/23/2022   Lab Results  Component Value Date   WBC 7.1 03/15/2023   HGB 14.3 03/15/2023   HCT 42.8 03/15/2023   MCV 85 03/15/2023   PLT 359 03/15/2023   Lab Results  Component Value Date   NA 140 03/15/2023   K 4.8 03/15/2023   CO2 24 03/15/2023   GLUCOSE 94 03/15/2023   BUN 9 03/15/2023   CREATININE 0.70 03/15/2023   BILITOT 0.3 03/15/2023   ALKPHOS 84 03/15/2023   AST 19 03/15/2023   ALT 18 03/15/2023   PROT 6.9 03/15/2023    ALBUMIN 4.5 03/15/2023   CALCIUM 9.7 03/15/2023   EGFR 114 03/15/2023   Lab Results  Component Value Date   CHOL 152 07/05/2022   Lab Results  Component Value Date   HDL 56 07/05/2022   Lab Results  Component Value Date   LDLCALC 79 07/05/2022   Lab Results  Component Value Date   TRIG 92 07/05/2022   Lab Results  Component Value Date   CHOLHDL 2.7 07/05/2022   Lab Results  Component Value Date   HGBA1C 5.5 07/05/2022       Assessment & Plan:  Other microscopic hematuria Assessment & Plan: -Order CT Urogram at patient's preferred location to assess for current kidney stones. -Advise to increase fluid intake and use Tylenol for pain management. -Provide patient education on kidney stones.  Orders: -     POCT URINALYSIS DIP (CLINITEK) -     CT RENAL STONE STUDY; Future  Right flank pain Assessment & Plan: Intermittent back pain for the past year, similar to previous kidney stone pain. No current pain. History of kidney stone removal procedure. Loose bowel movements associated with back pain, not menstrual cycle. No blood in urine noted. -Order CT Urogram at patient's preferred location to assess for current kidney stones. -Advise to increase fluid intake and use Tylenol for pain management. -Provide patient education on kidney stones.       No orders of the defined types were placed in this encounter.   Orders Placed This Encounter  Procedures   CT RENAL STONE STUDY   POCT URINALYSIS DIP (CLINITEK)     Follow-up: No follow-ups on file.  An After  Visit Summary was printed and given to the patient.  Blane Ohara, MD [facility] [phone]

## 2023-09-21 ENCOUNTER — Ambulatory Visit: Payer: Commercial Managed Care - HMO | Admitting: Family Medicine

## 2023-09-21 ENCOUNTER — Encounter: Payer: Self-pay | Admitting: Family Medicine

## 2023-09-21 VITALS — BP 98/70 | HR 79 | Temp 97.2°F | Resp 14 | Ht 65.0 in | Wt 228.0 lb

## 2023-09-21 DIAGNOSIS — R3129 Other microscopic hematuria: Secondary | ICD-10-CM | POA: Diagnosis not present

## 2023-09-21 DIAGNOSIS — R109 Unspecified abdominal pain: Secondary | ICD-10-CM | POA: Diagnosis not present

## 2023-09-21 LAB — POCT URINALYSIS DIP (CLINITEK)
Bilirubin, UA: NEGATIVE
Glucose, UA: NEGATIVE mg/dL
Ketones, POC UA: NEGATIVE mg/dL
Leukocytes, UA: NEGATIVE
Nitrite, UA: NEGATIVE
Spec Grav, UA: 1.02 (ref 1.010–1.025)
Urobilinogen, UA: 0.2 U/dL
pH, UA: 6 (ref 5.0–8.0)

## 2023-09-21 NOTE — Assessment & Plan Note (Signed)
-  Order CT Urogram at patient's preferred location to assess for current kidney stones. -Advise to increase fluid intake and use Tylenol for pain management. -Provide patient education on kidney stones.

## 2023-09-21 NOTE — Assessment & Plan Note (Signed)
Intermittent back pain for the past year, similar to previous kidney stone pain. No current pain. History of kidney stone removal procedure. Loose bowel movements associated with back pain, not menstrual cycle. No blood in urine noted. -Order CT Urogram at patient's preferred location to assess for current kidney stones. -Advise to increase fluid intake and use Tylenol for pain management. -Provide patient education on kidney stones.

## 2023-09-21 NOTE — Patient Instructions (Signed)
VISIT SUMMARY:  Today, we discussed your intermittent back pain, which has been ongoing for the past year and is similar to the pain you experienced with previous kidney stones. We also reviewed your menstrual cycle symptoms and found no significant concerns.  YOUR PLAN:  -RECURRENT KIDNEY STONES: Recurrent kidney stones are hard deposits made of minerals and salts that form inside your kidneys and can cause significant pain. We will order a CT Urogram to check for current kidney stones. In the meantime, please increase your fluid intake and use Tylenol for pain management. We also provided you with education on kidney stones.  -MENSTRUAL CYCLE: Your menstrual cycle symptoms are normal and there are no significant concerns at this time. No changes to your current management plan are needed.  INSTRUCTIONS:  Please follow up after the CT Urogram results are available.

## 2023-09-25 ENCOUNTER — Ambulatory Visit (HOSPITAL_BASED_OUTPATIENT_CLINIC_OR_DEPARTMENT_OTHER): Admission: RE | Admit: 2023-09-25 | Payer: Commercial Managed Care - HMO | Source: Ambulatory Visit

## 2023-09-25 ENCOUNTER — Telehealth (HOSPITAL_BASED_OUTPATIENT_CLINIC_OR_DEPARTMENT_OTHER): Payer: Self-pay | Admitting: Family Medicine

## 2023-10-04 ENCOUNTER — Telehealth: Payer: Self-pay

## 2023-10-04 NOTE — Telephone Encounter (Signed)
Copied from CRM [id number]. Topic: Clinical - Request for Lab/Test Order >> Oct 04, 2023  8:49 AM Maxwell Marion wrote: Reason for CRM: Nonnie Done from [facility] called to request the order for the CT of the abdomen and pelvis w/o contrast be sent to [facility] in [place]. Fax number is [phone]

## 2023-10-05 ENCOUNTER — Telehealth: Payer: Self-pay | Admitting: Family Medicine

## 2023-10-05 ENCOUNTER — Telehealth: Payer: Self-pay

## 2023-10-05 NOTE — Telephone Encounter (Signed)
I did not see that the CRM was already convert to a telephone encounter. However, I have already forwarded the telephone encounter that I created and sent to [facility], LPN. Also, I have spoke with her over the phone.

## 2023-10-05 NOTE — Telephone Encounter (Signed)
Copied from CRM [id number]. Topic: Medical Record Request - Provider/Facility Request >> Oct 05, 2023  9:38 AM Ivette P wrote: Reason for CRM: Kareem from [facility] called in to request the order for the CT of the abdomen and pelvis w/o contrast be sent to [facility] in [place]. Fax number is [phone]. Callback number [phone]

## 2023-10-08 ENCOUNTER — Other Ambulatory Visit: Payer: Self-pay | Admitting: Family Medicine

## 2023-10-08 ENCOUNTER — Telehealth: Payer: Self-pay

## 2023-10-08 ENCOUNTER — Other Ambulatory Visit: Payer: Self-pay

## 2023-10-08 DIAGNOSIS — R109 Unspecified abdominal pain: Secondary | ICD-10-CM

## 2023-10-08 DIAGNOSIS — R3129 Other microscopic hematuria: Secondary | ICD-10-CM

## 2023-10-08 NOTE — Telephone Encounter (Signed)
Copied from CRM [id number]. Topic: General - Other >> Oct 08, 2023 10:50 AM Dimitri Ped wrote: Reason for CRM: [employer] is calling cause the patient insurance was not approved . Ct scan  was not approved and insurance is calling to say the insurance is good please give call back to [place] .give patient a call back concerning the ct scamn and the pre authorization

## 2023-10-08 NOTE — Telephone Encounter (Signed)
Called patient and put new order.

## 2023-10-08 NOTE — Telephone Encounter (Signed)
PA has already been completed. See media for additional information. PA approval has been sent via epic to number listed below.    Copied from CRM [id number]. Topic: Clinical - Request for Lab/Test Order >> Oct 08, 2023 10:30 AM Elle L wrote: Reason for CRM: Nicholas Lose from [facility] called on behalf of the patient requesting a PA for a CT scan of the abdomen and pelvis at [facility]. Their fax number is [phone].

## 2023-10-08 NOTE — Telephone Encounter (Signed)
Called [facility] to change location for CT scan, per agent state Pt will need to call to change the location for scan.Reference #: [id number] I attempted to call Pt to inform her that she will need to call to change location; no answer and voicemail is full.

## 2023-10-08 NOTE — Telephone Encounter (Signed)
Faxed order to [facility]

## 2023-10-08 NOTE — Telephone Encounter (Signed)
Spoke with Rosann Auerbach in regards to auth #: [id number] DOS: 09/24/23-03/22/24 Location: [facility].  Reference #: [id number]  Please fax new order to [phone]

## 2023-10-12 ENCOUNTER — Encounter: Payer: Self-pay | Admitting: Family Medicine

## 2023-11-13 ENCOUNTER — Ambulatory Visit: Payer: Commercial Managed Care - HMO | Admitting: Family Medicine

## 2023-11-13 ENCOUNTER — Encounter: Payer: Self-pay | Admitting: Family Medicine

## 2023-11-13 ENCOUNTER — Other Ambulatory Visit: Payer: Self-pay | Admitting: Family Medicine

## 2023-11-13 VITALS — BP 118/78 | HR 78 | Temp 97.6°F | Resp 14 | Ht 65.0 in | Wt 231.0 lb

## 2023-11-13 DIAGNOSIS — R350 Frequency of micturition: Secondary | ICD-10-CM

## 2023-11-13 DIAGNOSIS — Z3201 Encounter for pregnancy test, result positive: Secondary | ICD-10-CM | POA: Diagnosis not present

## 2023-11-13 DIAGNOSIS — N912 Amenorrhea, unspecified: Secondary | ICD-10-CM | POA: Diagnosis not present

## 2023-11-13 DIAGNOSIS — O0991 Supervision of high risk pregnancy, unspecified, first trimester: Secondary | ICD-10-CM | POA: Insufficient documentation

## 2023-11-13 DIAGNOSIS — N898 Other specified noninflammatory disorders of vagina: Secondary | ICD-10-CM | POA: Diagnosis not present

## 2023-11-13 LAB — POCT URINALYSIS DIP (CLINITEK)
Bilirubin, UA: NEGATIVE
Blood, UA: NEGATIVE
Glucose, UA: NEGATIVE mg/dL
Ketones, POC UA: NEGATIVE mg/dL
Leukocytes, UA: NEGATIVE
Nitrite, UA: NEGATIVE
POC PROTEIN,UA: NEGATIVE
Spec Grav, UA: <=1.005 — AB (ref 1.010–1.025)
Urobilinogen, UA: 0.2 U/dL
pH, UA: 7 (ref 5.0–8.0)

## 2023-11-13 LAB — POCT URINE PREGNANCY: Preg Test, Ur: POSITIVE — AB

## 2023-11-13 NOTE — Assessment & Plan Note (Signed)
Pregnancy negative.  

## 2023-11-13 NOTE — Assessment & Plan Note (Signed)
Check aptima swab.

## 2023-11-13 NOTE — Progress Notes (Signed)
 Subjective:  Patient ID: Monica Randall, female    DOB: 1986-05-23  Age: 38 y.o. MRN: [id number]  Chief Complaint  Patient presents with   Positive Home Pregnany Test    HPI Patient is a Hispanic female with PCOS and G3P0020 who presents with breast tenderness, frequency of urination and missed her period on 10/24/23. She also mentioned some discomfort on her lower abdomen. She did at home pregnancy test and it showed positive. She started prenatal vitamins.  Her previous pregnancies occurred as follows: 38 years old (in [place]) with different partner.  Had undergone fertility treatments which sounded like clomiphene. Spontaneous miscarriage during first trimester.  38 [place] old in [place] , different partner. Sounds like first trimester. Lost fetus' heart rate.   Complaining of vaginal itching and mild odor. Not significant vaginal discharge.      11/13/2023   11:16 AM 03/15/2023    9:53 AM 05/15/2022   10:50 AM 08/30/2021   11:15 AM 07/20/2020    9:34 AM  Depression screen PHQ 2/9  Decreased Interest 0 0 1 1 0  Down, Depressed, Hopeless 0 0 1 1 0  PHQ - 2 Score 0 0 2 2 0  Altered sleeping 0 0 1    Tired, decreased energy 1 0 1    Change in appetite 0 0 1    Feeling bad or failure about yourself  0 0 0    Trouble concentrating 1 0 1    Moving slowly or fidgety/restless 0 0 1    Suicidal thoughts 0 0 0    PHQ-9 Score 2 0 7    Difficult doing work/chores Not difficult at all Not difficult at all           11/13/2023   11:15 AM  Fall Risk   Falls in the past year? 0  Number falls in past yr: 0  Injury with Fall? 0  Risk for fall due to : No Fall Risks  Follow up Falls evaluation completed    Patient Care Team: Sherre Clapper, MD as PCP - General (Family Medicine) Sherre Clapper, MD as Referring Physician (Family Medicine)   Review of Systems  Constitutional:  Negative for chills, fatigue and fever.  HENT:  Negative for congestion, ear pain, rhinorrhea and sore throat.    Respiratory:  Negative for cough and shortness of breath.   Cardiovascular:  Negative for chest pain.  Gastrointestinal:  Negative for abdominal pain, constipation, diarrhea, nausea and vomiting.  Endocrine: Positive for polyuria.  Genitourinary:  Negative for dysuria, urgency and vaginal discharge.  Musculoskeletal:  Negative for back pain and myalgias.  Neurological:  Negative for dizziness, weakness, light-headedness and headaches.  Psychiatric/Behavioral:  Negative for dysphoric mood. The patient is not nervous/anxious.     Current Outpatient Medications on File Prior to Visit  Medication Sig Dispense Refill   FIBER PO Take by mouth. Takes tablets or powder     MAGNESIUM PO Take by mouth.     Vitamin D , Ergocalciferol , (DRISDOL ) 1.25 MG (50000 UNIT) CAPS capsule Take 1 capsule (50,000 Units total) by mouth every 7 (seven) days. 12 capsule 1   No current facility-administered medications on file prior to visit.   Past Medical History:  Diagnosis Date   GERD (gastroesophageal reflux disease)    Kidney stones    Mild intermittent asthma 07/20/2020   as a child   Mixed hyperlipidemia 07/21/2020   Ovarian cyst    Prediabetes 07/21/2020   Past Surgical History:  Procedure Laterality  Date   GALLBLADDER SURGERY  2012   KIDNEY STONE SURGERY  2018   Laser surgery   OOPHORECTOMY Right 2011    Family History  Problem Relation Age of Onset   Cancer Maternal Aunt        leukemia   Thyroid disease Maternal Grandfather    Thyroid disease Other    Colon cancer Neg Hx    Esophageal cancer Neg Hx    Rectal cancer Neg Hx    Stomach cancer Neg Hx    Social History   Socioeconomic History   Marital status: Significant Other    Spouse name: Not on file   Number of children: Not on file   Years of education: Not on file   Highest education level: Not on file  Occupational History   Occupation: scientist, research (medical)  Tobacco Use   Smoking status: Never   Smokeless tobacco: Never  Vaping  Use   Vaping status: Never Used  Substance and Sexual Activity   Alcohol use: Yes    Comment: occ   Drug use: Never   Sexual activity: Yes    Partners: Male    Birth control/protection: Condom    Comment: 1st intercourse- 15, partners- more than 5  Other Topics Concern   Not on file  Social History Narrative   Not on file   Social Drivers of Health   Financial Resource Strain: Low Risk  (03/15/2023)   Overall Financial Resource Strain (CARDIA)    Difficulty of Paying Living Expenses: Not hard at all  Food Insecurity: No Food Insecurity (03/15/2023)   Hunger Vital Sign    Worried About Running Out of Food in the Last Year: Never true    Ran Out of Food in the Last Year: Never true  Transportation Needs: No Transportation Needs (03/15/2023)   PRAPARE - Administrator, Civil Service (Medical): No    Lack of Transportation (Non-Medical): No  Physical Activity: Sufficiently Active (03/15/2023)   Exercise Vital Sign    Days of Exercise per Week: 3 days    Minutes of Exercise per Session: 60 min  Stress: No Stress Concern Present (03/15/2023)   [employer] of Occupational Health - Occupational Stress Questionnaire    Feeling of Stress : Not at all  Social Connections: Moderately Isolated (03/15/2023)   Social Connection and Isolation Panel [NHANES]    Frequency of Communication with Friends and Family: More than three times a week    Frequency of Social Gatherings with Friends and Family: More than three times a week    Attends Religious Services: Never    Database Administrator or Organizations: No    Attends Engineer, Structural: Never    Marital Status: Living with partner    Objective:  BP 118/78   Pulse 78   Temp 97.6 F (36.4 C)   Resp 14   Ht 5' 5 (1.651 m)   Wt 231 lb (104.8 kg)   LMP 09/24/2023 (Approximate)   SpO2 98%   BMI 38.44 kg/m      11/13/2023   11:03 AM 09/21/2023    8:16 AM 04/18/2023    2:31 PM  BP/Weight  Systolic BP 118 98 102   Diastolic BP 78 70 72  Wt. (Lbs) 231 228 226  BMI 38.44 kg/m2 37.94 kg/m2 37.61 kg/m2    Physical Exam Vitals reviewed. Exam conducted with a chaperone present.  Constitutional:      Appearance: Normal appearance. She is normal weight.  Neck:     Vascular: No carotid bruit.  Cardiovascular:     Rate and Rhythm: Normal rate and regular rhythm.     Heart sounds: Normal heart sounds.  Pulmonary:     Effort: Pulmonary effort is normal. No respiratory distress.     Breath sounds: Normal breath sounds.  Abdominal:     General: Abdomen is flat. Bowel sounds are normal.     Palpations: Abdomen is soft.     Tenderness: There is no abdominal tenderness.  Genitourinary:    Exam position: Lithotomy position.     Pubic Area: No rash.      Labia:        Right: No rash.        Left: No rash.      Vagina: Vaginal discharge (very small amount) present.     Cervix: Normal.  Neurological:     Mental Status: She is alert and oriented to person, place, and time.  Psychiatric:        Mood and Affect: Mood normal.        Behavior: Behavior normal.     Diabetic Foot Exam - Simple   No data filed      Lab Results  Component Value Date   WBC 7.1 03/15/2023   HGB 14.3 03/15/2023   HCT 42.8 03/15/2023   PLT 359 03/15/2023   GLUCOSE 94 03/15/2023   CHOL 152 07/05/2022   TRIG 92 07/05/2022   HDL 56 07/05/2022   LDLCALC 79 07/05/2022   ALT 18 03/15/2023   AST 19 03/15/2023   NA 140 03/15/2023   K 4.8 03/15/2023   CL 103 03/15/2023   CREATININE 0.70 03/15/2023   BUN 9 03/15/2023   CO2 24 03/15/2023   TSH 1.600 11/23/2022   HGBA1C 5.5 07/05/2022      Assessment & Plan:    High-risk pregnancy in first trimester Assessment & Plan: Recommend urgent referral to obstetrics as she is at high risk.  Continue prenatal vitamins.  Orders: -     Ambulatory referral to Obstetrics / Gynecology  Amenorrhea Assessment & Plan: Pregnancy negative.   Orders: -     POCT urine  pregnancy  Frequency of urination Assessment & Plan: UA normal  Orders: -     POCT URINALYSIS DIP (CLINITEK)  Vaginal discharge Assessment & Plan: Check aptima swab.    Orders: -     NuSwab VG+, Candida 6sp     No orders of the defined types were placed in this encounter.   Orders Placed This Encounter  Procedures   Ambulatory referral to Obstetrics / Gynecology   POCT urine pregnancy   POCT URINALYSIS DIP (CLINITEK)     Follow-up: Return if symptoms worsen or fail to improve.   I,Katherina A Bramblett,acting as a scribe for Abigail Free, MD.,have documented all relevant documentation on the behalf of Abigail Free, MD,as directed by  Abigail Free, MD while in the presence of Abigail Free, MD.   An After Visit Summary was printed and given to the patient.  I attest that I have reviewed this visit and agree with the plan scribed by my staff.   Abigail Free, MD [facility] [phone]

## 2023-11-13 NOTE — Assessment & Plan Note (Signed)
Recommend urgent referral to obstetrics as she is at high risk.  Continue prenatal vitamins.

## 2023-11-13 NOTE — Assessment & Plan Note (Signed)
 UA normal.

## 2023-11-15 ENCOUNTER — Telehealth: Payer: Self-pay

## 2023-11-15 NOTE — Telephone Encounter (Signed)
 Copied from CRM [id number]. Topic: Referral - Request for Referral >> Nov 15, 2023 12:31 PM Powell HERO wrote: Did the patient discuss referral with their provider in the last year? Yes Patient found an office that accepts her insurance, please see below. Please reach out to the patient when she can schedule.  Appointment offered? Yes  Type of order/referral and detailed reason for visit: Pregnancy  Preference of office, provider, location: [facility], [address], [place], [place] [address] [phone]  If referral order, have you been seen by this specialty before? Yes (If Yes, this issue or another issue? When? Where?  Can we respond through MyChart? Yes, both

## 2023-11-15 NOTE — Telephone Encounter (Signed)
 Copied from CRM [id number]. Topic: Referral - Question >> Nov 15, 2023 10:42 AM Renea ORN wrote: Pt stated that the referral that was written, the practice doesn't take their insurance.  Please find her a location that accepts her insurance.. Please call [phone].

## 2023-11-16 ENCOUNTER — Encounter: Payer: Self-pay | Admitting: Family Medicine

## 2023-11-16 LAB — NUSWAB VG+, CANDIDA 6SP
C PARAPSILOSIS/TROPICALIS: NEGATIVE
Candida albicans, NAA: NEGATIVE
Candida glabrata, NAA: NEGATIVE
Candida krusei, NAA: NEGATIVE
Candida lusitaniae, NAA: NEGATIVE
Chlamydia trachomatis, NAA: NEGATIVE
Neisseria gonorrhoeae, NAA: NEGATIVE
Trich vag by NAA: NEGATIVE

## 2024-02-26 ENCOUNTER — Other Ambulatory Visit: Payer: Self-pay | Admitting: Obstetrics & Gynecology

## 2024-02-26 DIAGNOSIS — E282 Polycystic ovarian syndrome: Secondary | ICD-10-CM

## 2024-02-26 MED ORDER — AUROVELA 24 FE 1-20 MG-MCG(24) PO TABS: 1.0000 | ORAL_TABLET | Freq: Every day | ORAL | 0 refills | Status: AC

## 2024-02-26 NOTE — Telephone Encounter (Signed)
 Med refill request: Aurovela 24 FE Last AEX: 12/06/22 Dr. Lavoie Next AEX: none scheduled Last MMG (if hormonal med) n/a Refill denied.  Needs appointment (staff message sent to appointment schedulers to contact patient to make apt) Sent to provider for review.

## 2024-04-01 ENCOUNTER — Other Ambulatory Visit: Payer: Self-pay | Admitting: Family Medicine

## 2024-06-02 ENCOUNTER — Telehealth: Payer: Self-pay | Admitting: Family Medicine

## 2024-06-02 NOTE — Telephone Encounter (Unsigned)
 Copied from CRM [id number]. Topic: Clinical - Medication Refill >> Jun 02, 2024 10:33 AM Emylou G wrote: Medication: Vitamin D , Ergocalciferol , (DRISDOL ) 1.25 MG (50000 UNIT) CAPS capsule  Has the patient contacted their pharmacy? No (Agent: If no, request that the patient contact the pharmacy for the refill. If patient does not wish to contact the pharmacy document the reason why and proceed with request.) (Agent: If yes, when and what did the pharmacy advise?)  This is the patient's preferred pharmacy:  [id number] [place], [place] - [address] AT [facility] [address] [place] [address] Phone: [phone] Fax: [phone]    Is this the correct pharmacy for this prescription? Yes If no, delete pharmacy and type the correct one.   Has the prescription been filled recently? No  Is the patient out of the medication? Yes  Has the patient been seen for an appointment in the last year OR does the patient have an upcoming appointment? Yes  Can we respond through MyChart? Yes  Agent: Please be advised that Rx refills may take up to 3 business days. We ask that you follow-up with your pharmacy.

## 2024-06-03 MED ORDER — VITAMIN D (ERGOCALCIFEROL) 1.25 MG (50000 UNIT) PO CAPS: 50000.0000 [IU] | ORAL_CAPSULE | ORAL | 1 refills | Status: AC

## 2024-06-03 NOTE — Telephone Encounter (Signed)
 Refill sent to pharmacy.
